# Patient Record
Sex: Female | Born: 1965 | Race: White | Hispanic: No | Marital: Married | Smoking: Never smoker
Health system: Southern US, Community
[De-identification: ages and names within clinical notes are randomized; demographics above are authoritative.]

## PROBLEM LIST (undated history)

## (undated) DIAGNOSIS — E039 Hypothyroidism, unspecified: Secondary | ICD-10-CM

## (undated) DIAGNOSIS — S92909A Unspecified fracture of unspecified foot, initial encounter for closed fracture: Secondary | ICD-10-CM

## (undated) DIAGNOSIS — K219 Gastro-esophageal reflux disease without esophagitis: Secondary | ICD-10-CM

## (undated) DIAGNOSIS — C801 Malignant (primary) neoplasm, unspecified: Secondary | ICD-10-CM

## (undated) HISTORY — DX: Gastro-esophageal reflux disease without esophagitis: K21.9

## (undated) HISTORY — DX: Unspecified fracture of unspecified foot, initial encounter for closed fracture: S92.909A

## (undated) HISTORY — DX: Hypothyroidism, unspecified: E03.9

---

## 1986-06-20 HISTORY — PX: NASAL SEPTOPLASTY W/ TURBINOPLASTY: SHX2070

## 2002-11-20 HISTORY — PX: SKIN GRAFT: SHX250

## 2004-02-01 ENCOUNTER — Encounter: Payer: Self-pay | Admitting: Internal Medicine

## 2004-02-23 ENCOUNTER — Encounter: Payer: Self-pay | Admitting: Internal Medicine

## 2005-05-19 ENCOUNTER — Ambulatory Visit: Payer: Self-pay | Admitting: Internal Medicine

## 2005-08-22 ENCOUNTER — Ambulatory Visit: Payer: Self-pay | Admitting: Obstetrics and Gynecology

## 2005-08-22 ENCOUNTER — Ambulatory Visit: Payer: Self-pay | Admitting: Internal Medicine

## 2005-12-01 ENCOUNTER — Ambulatory Visit: Payer: Self-pay | Admitting: Internal Medicine

## 2005-12-12 ENCOUNTER — Ambulatory Visit: Payer: Self-pay | Admitting: Gastroenterology

## 2006-08-26 ENCOUNTER — Ambulatory Visit: Payer: Self-pay | Admitting: Obstetrics and Gynecology

## 2007-07-09 ENCOUNTER — Telehealth: Payer: Self-pay | Admitting: Internal Medicine

## 2007-07-22 ENCOUNTER — Encounter: Payer: Self-pay | Admitting: Internal Medicine

## 2007-07-23 ENCOUNTER — Encounter: Payer: Self-pay | Admitting: Internal Medicine

## 2007-09-01 ENCOUNTER — Ambulatory Visit: Payer: Self-pay | Admitting: Obstetrics and Gynecology

## 2008-09-05 ENCOUNTER — Ambulatory Visit: Payer: Self-pay | Admitting: Obstetrics and Gynecology

## 2009-01-31 ENCOUNTER — Ambulatory Visit: Payer: Self-pay | Admitting: Internal Medicine

## 2009-01-31 DIAGNOSIS — K219 Gastro-esophageal reflux disease without esophagitis: Secondary | ICD-10-CM | POA: Insufficient documentation

## 2009-01-31 DIAGNOSIS — E039 Hypothyroidism, unspecified: Secondary | ICD-10-CM

## 2009-02-02 LAB — CONVERTED CEMR LAB
Basophils Relative: 0.8 % (ref 0.0–3.0)
CO2: 31 meq/L (ref 19–32)
Calcium: 9.2 mg/dL (ref 8.4–10.5)
Chloride: 106 meq/L (ref 96–112)
Creatinine, Ser: 0.8 mg/dL (ref 0.4–1.2)
Free T4: 0.8 ng/dL (ref 0.6–1.6)
Hemoglobin: 14.1 g/dL (ref 12.0–15.0)
Lymphocytes Relative: 26.9 % (ref 12.0–46.0)
MCHC: 34.8 g/dL (ref 30.0–36.0)
MCV: 91.6 fL (ref 78.0–100.0)
Neutrophils Relative %: 64.3 % (ref 43.0–77.0)
RBC: 4.42 M/uL (ref 3.87–5.11)
RDW: 12.7 % (ref 11.5–14.6)
Sodium: 141 meq/L (ref 135–145)

## 2009-09-07 ENCOUNTER — Ambulatory Visit: Payer: Self-pay | Admitting: Obstetrics and Gynecology

## 2010-01-10 ENCOUNTER — Ambulatory Visit: Payer: Self-pay | Admitting: Family Medicine

## 2010-01-10 DIAGNOSIS — J020 Streptococcal pharyngitis: Secondary | ICD-10-CM

## 2010-04-19 ENCOUNTER — Ambulatory Visit: Payer: Self-pay | Admitting: Internal Medicine

## 2010-04-19 DIAGNOSIS — N943 Premenstrual tension syndrome: Secondary | ICD-10-CM | POA: Insufficient documentation

## 2010-04-23 LAB — CONVERTED CEMR LAB: TSH: 4.25 microintl units/mL (ref 0.35–5.50)

## 2010-08-07 ENCOUNTER — Encounter: Payer: Self-pay | Admitting: Internal Medicine

## 2010-08-15 ENCOUNTER — Encounter: Payer: Self-pay | Admitting: Internal Medicine

## 2010-09-10 ENCOUNTER — Ambulatory Visit: Payer: Self-pay | Admitting: Obstetrics and Gynecology

## 2010-09-19 ENCOUNTER — Ambulatory Visit: Payer: Self-pay | Admitting: Internal Medicine

## 2010-09-24 ENCOUNTER — Encounter: Payer: Self-pay | Admitting: Internal Medicine

## 2010-11-19 NOTE — Assessment & Plan Note (Signed)
Summary: PROBLEM W/ THYROID ?? CYD   Vital Signs:  Patient profile:   45 year old female Weight:      128 pounds Temp:     98.4 degrees F oral Pulse rate:   68 / minute Pulse rhythm:   regular BP sitting:   130 / 80  (right arm) Cuff size:   regular  Vitals Entered By: Lowella Petties CMA (April 19, 2010 11:26 AM) CC: Discuss hormones   History of Present Illness: "my hormones are going ballistic" Had similar spell about 3 years ago but not as bad Gets a bit weepy at times perimenstrually but last week bad Irritable, crying, wanted to stay away from people up and down from day to day  "I felt like a blanket over me" then it lifted and feels back to her normal  Had period before this spell notes hot flashes around periods periods are regular on the OCP---fairly light  In generaly feels fine no depression  Mom went through menopause  ~41 but maternal GM went close to 60   Allergies: 1)  ! Adult Aspirin Low Strength (Aspirin)  Past History:  Past medical, surgical, family and social histories (including risk factors) reviewed for relevance to current acute and chronic problems.  Past Medical History: Reviewed history from 01/31/2009 and no changes required. GERD Hypothyroidism  Past Surgical History: Reviewed history from 01/31/2009 and no changes required. 1987/9 Septoplasty and turbinate reduction 2/4 Nasal skin graft (for epistaxis) 5/05 Echo normal  Family History: Reviewed history from 01/31/2009 and no changes required. Dad with atrial fib, colon cancer, HTN Mom healthy 1 brohter Pat aunt also with colon cancer CAD in maternal uncle Arthritis in family  Social History: Reviewed history from 01/31/2009 and no changes required. 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   Psych:  normally interactive, good eye contact, not anxious appearing, and not depressed appearing.     Impression & Recommendations:  Problem # 1:  PREMENSTRUAL SYNDROME (ICD-625.4) Assessment Comment Only may have premature menopause already on hormones only one month and had brief spell years ago as well may need to consider fluoxetine or venlafaxine but too soon to tell counselled entire 15 minute visit  Problem # 2:  HYPOTHYROIDISM (ICD-244.9) Assessment: Comment Only  due for labs  Her updated medication list for this problem includes:    Levoxyl 50 Mcg Tabs (Levothyroxine sodium) .Marland Kitchen... Take 1 by mouth once daily  Orders: Venipuncture (04540) TLB-TSH (Thyroid Stimulating Hormone) (84443-TSH) TLB-T4 (Thyrox), Free [phone])  Complete Medication List: 1)  Levoxyl 50 Mcg Tabs (Levothyroxine sodium) .... Take 1 by mouth once daily 2)  Ortho-cyclen (28) 0.25-35 Mg-mcg Tabs (Norgestimate-eth estradiol) .... Take by mouth daily as directed  Patient Instructions: 1)  Please schedule a follow-up appointment in 4 months for physical  Prior Medications (reviewed today): LEVOXYL 50 MCG TABS (LEVOTHYROXINE SODIUM) take 1 by mouth once daily ORTHO-CYCLEN (28) 0.25-35 MG-MCG TABS (NORGESTIMATE-ETH ESTRADIOL) take by mouth daily as directed Current Allergies: ! ADULT ASPIRIN LOW STRENGTH (ASPIRIN)

## 2010-11-19 NOTE — Assessment & Plan Note (Signed)
Summary: SWOLLEN GLANDS,FATIGUE/CLE   Vital Signs:  Patient profile:   45 year old female Height:      63 inches Weight:      127.38 pounds BMI:     22.65 Temp:     98.2 degrees F oral Pulse rate:   84 / minute Pulse rhythm:   regular BP sitting:   124 / 74  (left arm) Cuff size:   regular  Vitals Entered By: Delilah Shan CMA Duncan Dull) (January 10, 2010 3:57 PM) CC: Swollen glands, fatigue.  Dx. with Strep 5 weeks ago.   History of Present Illness: 45 yo here for follow up urgent care visit.  Was seen at urgent care on 2/21, diagnosed with strep throat and ear infection. Given 10 day course of Augmentin.  Symptoms did not completely resolve, so was then given a Zpack. Fevers, sore throat, ear pain have all improved. Still tired and feels like glands in throat are swollen. No cough, shortness of breath.  Does not have records with her today.  Current Medications (verified): 1)  Levoxyl 50 Mcg Tabs (Levothyroxine Sodium) .... Take 1 By Mouth Once Daily 2)  Bcp .... Once Daily  Allergies: 1)  ! Adult Aspirin Low Strength (Aspirin)  Review of Systems      See HPI General:  Denies chills and fever. ENT:  Denies nasal congestion and sore throat. Resp:  Denies cough, shortness of breath, sputum productive, and wheezing.  Physical Exam  General:  alert and normal appearance.   Ears:  small amount clear fluid TMs bilaterally, otherwise normal. Mouth:  Oral mucosa and oropharynx without lesions or exudates.  Teeth in good repair. Lungs:  normal respiratory effort and normal breath sounds.   Heart:  normal rate, regular rhythm, no murmur, and no gallop.   Extremities:  no edema Cervical Nodes:  No lymphadenopathy noted Axillary Nodes:  No palpable lymphadenopathy Psych:  normally interactive, good eye contact, not anxious appearing, and not depressed appearing.     Impression & Recommendations:  Problem # 1:  STREPTOCOCCAL SORE THROAT (ICD-034.0) Assessment Improved No  signs of persistent infection. Provided reassurance to pt.  Do not believe she needs another round of abx. Continue supportive care with Ibuprofen, sudafed as needed.  Complete Medication List: 1)  Levoxyl 50 Mcg Tabs (Levothyroxine sodium) .... Take 1 by mouth once daily 2)  Bcp  .... Once daily  Current Allergies (reviewed today): ! ADULT ASPIRIN LOW STRENGTH (ASPIRIN)

## 2010-11-19 NOTE — Letter (Signed)
Summary: [facility]  [facility]   Imported By: Lanelle Bal 09/19/2010 12:07:00  _____________________________________________________________________  External Attachment:    Type:   Image     Comment:   External Document

## 2010-11-19 NOTE — Assessment & Plan Note (Signed)
Summary: FU ON LAB WORK/DLO   Vital Signs:  Patient profile:   45 year old female Weight:      129 pounds Temp:     98.6 degrees F oral Pulse rate:   76 / minute Pulse rhythm:   regular BP sitting:   118 / 68  (left arm) Cuff size:   regular  Vitals Entered By: Mervin Hack CMA Duncan Dull) (September 19, 2010 3:57 PM) CC: follow-up visit   History of Present Illness: Having ongoing issues saw rheumatologist at [facility]--note reviewed labs not sent yet but were reportedly normal  Has had some improvement since going off caffeine still concerned about hair loss  has started taking B complex and still taking B complex  Has had some cycling of symptoms but not clearly with periods as it was before  still gets occ headaches but not that frequent  Allergies: 1)  ! Adult Aspirin Low Strength (Aspirin)  Past History:  Past medical, surgical, family and social histories (including risk factors) reviewed for relevance to current acute and chronic problems.  Past Medical History: Reviewed history from 01/31/2009 and no changes required. GERD Hypothyroidism  Past Surgical History: Reviewed history from 01/31/2009 and no changes required. 1987/9 Septoplasty and turbinate reduction 2/4 Nasal skin graft (for epistaxis) 5/05 Echo normal  Family History: Reviewed history from 01/31/2009 and no changes required. Dad with atrial fib, colon cancer, HTN Mom healthy 1 brohter Pat aunt also with colon cancer CAD in maternal uncle Arthritis in family  Social History: Reviewed history from 01/31/2009 and no changes required. 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 Take 1 by mouth once daily 2)  Ortho-cyclen (28) 0.25-35 Mg-mcg Tabs (Norgestimate-eth estradiol) .... Take by mouth daily as directed 3)  B Complex Tabs (B complex vitamins) .... Take 1 by mouth once daily 4)  Multivitamins Caps (Multiple vitamin) .... Take 1 by mouth once daily  Patient Instructions: 1)  Please schedule a follow-up appointment in 4-6  months for physical   Orders Added: 1)  Est. Patient Level III [[address]]    Current Allergies (reviewed today): ! ADULT ASPIRIN LOW STRENGTH (ASPIRIN)

## 2010-11-19 NOTE — Letter (Signed)
Summary: [facility]  [facility]   Imported By: Maryln Gottron 08/22/2010 13:53:21  _____________________________________________________________________  External Attachment:    Type:   Image     Comment:   External Document  Appended Document: [facility] seen for back and neck pain checking x-rays concerned about sacroiliitis trying PT

## 2010-11-21 NOTE — Letter (Signed)
Summary: [facility]  [facility]   Imported By: Maryln Gottron 10/04/2010 14:20:35  _____________________________________________________________________  External Attachment:    Type:   Image     Comment:   External Document  Appended Document: [facility] seen for  back pain and spasm Conservative symptom rx given

## 2010-12-02 ENCOUNTER — Emergency Department: Payer: Self-pay | Admitting: Internal Medicine

## 2010-12-02 DIAGNOSIS — K8020 Calculus of gallbladder without cholecystitis without obstruction: Secondary | ICD-10-CM | POA: Insufficient documentation

## 2010-12-12 ENCOUNTER — Ambulatory Visit (INDEPENDENT_AMBULATORY_CARE_PROVIDER_SITE_OTHER): Payer: BC Managed Care – PPO | Admitting: Internal Medicine

## 2010-12-12 ENCOUNTER — Encounter: Payer: Self-pay | Admitting: Internal Medicine

## 2010-12-12 DIAGNOSIS — K8020 Calculus of gallbladder without cholecystitis without obstruction: Secondary | ICD-10-CM

## 2010-12-17 NOTE — Assessment & Plan Note (Signed)
Summary: ER follow up/ [facility]/alc   Vital Signs:  Patient profile:   45 year old female Weight:      130 pounds BMI:     23.11 Temp:     98.7 degrees F oral BP sitting:   118 / 60  (left arm) Cuff size:   regular  Vitals Entered By: Mervin Hack CMA Duncan Dull) (December 12, 2010 4:35 PM) CC: hosptial follow-up   History of Present Illness: Seen in [facility] ER 10 days ago  Cooked brownies and then ate some started with cramping Awoke some time later with worsened pain Got sharper and sharper under right ribs Got sweaty and then hard to breathe  suddenly improved so she waited started again in an hour Went to ER  ~7AM Transaminases up but not alk phos or bilirubin Ultrasound did show non shadowing mass---?stone vs sludge??  Allergies: 1)  ! Adult Aspirin Low Strength (Aspirin)  Past History:  Past medical, surgical, family and social histories (including risk factors) reviewed for relevance to current acute and chronic problems.  Past Medical History: Reviewed history from 01/31/2009 and no changes required. GERD Hypothyroidism  Past Surgical History: Reviewed history from 01/31/2009 and no changes required. 1987/9 Septoplasty and turbinate reduction 2/4 Nasal skin graft (for epistaxis) 5/05 Echo normal  Family History: Reviewed history from 01/31/2009 and no changes required. Dad with atrial fib, colon cancer, HTN Mom healthy 1 brohter Pat aunt also with colon cancer CAD in maternal uncle Arthritis in family  Social History: Reviewed history from 01/31/2009 and no changes required. 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   Neck:  supple, no masses, and no cervical lymphadenopathy.   Lungs:  normal respiratory effort, no intercostal retractions, no  accessory muscle use, and normal breath sounds.   Heart:  normal rate, regular rhythm, no murmur, and no gallop.   Abdomen:  soft, non-tender, and no masses.   Murphy's sign absent Extremities:  no edema   Impression & Recommendations:  Problem # 1:  BILIARY COLIC (ICD-574.20) Assessment New classic history of biliary colic in 40+ woman Transaminases elevated without alk phos so not classic picture Gallbladder not thickened--may have stone in there but not clear cut  given classic history, should proceed with surgical evaluation she wants to set this up at [employer] where she works She has ER labs and I will give her note she will call if she needs referral  Complete Medication List: 1)  Levoxyl 50 Mcg Tabs (Levothyroxine sodium) .... Take 1 by mouth once daily 2)  Ortho-cyclen (28) 0.25-35 Mg-mcg Tabs (Norgestimate-eth estradiol) .... Take by mouth daily as directed 3)  B Complex Tabs (B complex vitamins) .... Take 1 by mouth once daily 4)  Multivitamins Caps (Multiple vitamin) .... Take 1 by mouth once daily  Patient Instructions: 1)  Please set up with general surgeon at [facility]. Call if you need my help with this   Orders Added: 1)  Est. Patient Level IV [04540]    Current Allergies (reviewed today): ! ADULT ASPIRIN LOW STRENGTH (ASPIRIN)

## 2010-12-28 ENCOUNTER — Encounter: Payer: Self-pay | Admitting: Internal Medicine

## 2011-01-07 ENCOUNTER — Telehealth: Payer: Self-pay | Admitting: *Deleted

## 2011-01-07 DIAGNOSIS — R040 Epistaxis: Secondary | ICD-10-CM

## 2011-01-07 NOTE — Telephone Encounter (Signed)
Pt has a history of nose bleeds, has been seeing [facility] but wants to change to [facility] de[facility], Dr. Gillermina Hu.  This is close to her job.  She will need a referral from [place] in order to go there.

## 2011-01-14 ENCOUNTER — Telehealth: Payer: Self-pay | Admitting: *Deleted

## 2011-01-14 NOTE — Telephone Encounter (Signed)
Is she talking about an ENT for the nosebleeds?? I think I made a referral last week  Is this something else? Please clarify--then send to [facility] as I think referral already made

## 2011-01-14 NOTE — Telephone Encounter (Signed)
Patient says that she has found a surgeon that she wants to establish with like you had advised her to do. She wants to see Dr. Radene Gunning with [facility]. They need an order and her office visit faxed to them . [phone].

## 2011-01-15 NOTE — Telephone Encounter (Signed)
Patient has history classic history of biliary colic. She was seen for ER follow up in feb. And she says that you had told her to proceed with surgical evaluation.

## 2011-01-16 NOTE — Telephone Encounter (Signed)
Faxed all notes to Dr Radene Gunning in [place] . MK

## 2011-02-24 ENCOUNTER — Ambulatory Visit (INDEPENDENT_AMBULATORY_CARE_PROVIDER_SITE_OTHER): Payer: BC Managed Care – PPO | Admitting: Internal Medicine

## 2011-02-24 ENCOUNTER — Encounter: Payer: Self-pay | Admitting: Internal Medicine

## 2011-02-24 VITALS — BP 108/62 | HR 80 | Temp 98.3°F | Ht 63.0 in | Wt 130.0 lb

## 2011-02-24 DIAGNOSIS — E039 Hypothyroidism, unspecified: Secondary | ICD-10-CM

## 2011-02-24 DIAGNOSIS — K219 Gastro-esophageal reflux disease without esophagitis: Secondary | ICD-10-CM

## 2011-02-24 DIAGNOSIS — Z0000 Encounter for general adult medical examination without abnormal findings: Secondary | ICD-10-CM

## 2011-02-24 NOTE — Progress Notes (Signed)
Subjective:    Patient ID: Amy Riley, female    DOB: 1966-06-19, 45 y.o.   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  , Disp: , Rfl: ;  levothyroxine (SYNTHROID, LEVOTHROID) 50 MCG tablet, Take 50 mcg by mouth daily.  , Disp: , Rfl: ;  Multiple Vitamin (MULTIVITAMIN PO), Take by mouth daily.  , Disp: , Rfl: ;  norgestimate-ethinyl estradiol (ORTHO-CYCLEN) 0.25-35 MG-MCG per tablet, Take 1 tablet by mouth daily.  , Disp: , Rfl:   Past Medical History  Diagnosis Date  . GERD (gastroesophageal reflux disease)   . Hypothyroidism     Past Surgical History  Procedure Date  . Nasal septoplasty w/ turbinoplasty 06/1986    reduction  . Skin graft 11/2002    Nasal (for epistaxis)    Family History  Problem Relation Age of Onset  . Heart disease Father     atrial fib  . Cancer Father     colon cancer  . Hypertension Father   . Heart disease Maternal Uncle     CAD  . Cancer Paternal Aunt     colon cancer    History   Social History  . Marital Status: Married    Spouse Name: N/A    Number of Children: 0  . Years of Education: N/A   Occupational History  . 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 Smoking status: Never Smoker   . Smokeless tobacco: Not on file  . Alcohol Use: No  . Drug Use: Not on file  . Sexually Active: Not on file   Other Topics Concern  . Not on file   Social History Narrative   Regular exercise Yes   Review of Systems    Constitutional: Negative for fever, fatigue and unexpected weight change.       Wears seat belt  HENT: Positive for neck pain, dental problem and tinnitus. Negative for hearing loss.        Seeing new ENT--prior one is moving Seasonal allergies Chronic tinnitus  Eyes: Negative for visual disturbance.       No focal vision loss or diplopia  Respiratory: Negative for cough, chest tightness and shortness of breath.   Cardiovascular: Positive for palpitations and leg swelling. Negative for chest pain.       Strong heartbeat---echo fine in past Socks make mark on calves  Gastrointestinal: Negative for nausea, vomiting, abdominal pain, constipation and blood in stool.       No heartburn  Genitourinary: Negative for dysuria, urgency, difficulty urinating, menstrual problem and dyspareunia.  Musculoskeletal: Positive for arthralgias. Negative for back pain and joint swelling.       Chronic knee pain Back is mostly better  Skin: Negative for rash.       No suspicious lesions but has some itchy "pinhead" red dots---comes and goes (on torso)  Neurological: Positive for headaches. Negative for dizziness, syncope, weakness and numbness.       Was having  daily headaches---hopefully will be better after abscess done  Hematological: Negative for adenopathy. Does not bruise/bleed easily.  Psychiatric/Behavioral: Negative for suicidal ideas and dysphoric mood. The patient is not nervous/anxious.        Objective:   Physical Exam  Constitutional: She is oriented to person, place, and time. She appears well-developed and well-nourished. No distress.  HENT:  Head: Normocephalic and atraumatic.  Right Ear: External ear normal.  Left Ear: External ear normal.  Mouth/Throat: Oropharynx is clear and moist. No oropharyngeal exudate.       TMs fine  Eyes: Conjunctivae and EOM are normal. Pupils are equal, round, and reactive to light.       Fundi benign  Neck: Normal range of motion. Neck supple. No  thyromegaly present.  Cardiovascular: Normal rate, regular rhythm, normal heart sounds and intact distal pulses.  Exam reveals no gallop.   No murmur heard. Pulmonary/Chest: Effort normal and breath sounds normal. No respiratory distress. She has no wheezes. She has no rales.  Abdominal: Soft. She exhibits no mass. There is no tenderness.  Musculoskeletal: Normal range of motion. She exhibits no edema and no tenderness.  Lymphadenopathy:    She has no cervical adenopathy.  Neurological: She is alert and oriented to person, place, and time. She exhibits normal muscle tone.       No weakness  Skin: Skin is warm. No rash noted.       Slight dry papules--discussed cortaid  Psychiatric: She has a normal mood and affect. Her behavior is normal. Judgment and thought content normal.          Assessment & Plan:

## 2011-02-25 LAB — CBC WITH DIFFERENTIAL/PLATELET
Basophils Absolute: 0.3 10*3/uL — ABNORMAL HIGH (ref 0.0–0.1)
Basophils Relative: 3.4 % — ABNORMAL HIGH (ref 0.0–3.0)
Eosinophils Relative: 2.1 % (ref 0.0–5.0)
HCT: 38.7 % (ref 36.0–46.0)
Hemoglobin: 13.4 g/dL (ref 12.0–15.0)
Lymphocytes Relative: 26.7 % (ref 12.0–46.0)
Lymphs Abs: 2.2 10*3/uL (ref 0.7–4.0)
Monocytes Relative: 9.2 % (ref 3.0–12.0)
Neutro Abs: 4.9 10*3/uL (ref 1.4–7.7)
RBC: 4.15 Mil/uL (ref 3.87–5.11)
RDW: 14 % (ref 11.5–14.6)

## 2011-02-25 LAB — HEPATIC FUNCTION PANEL
Albumin: 3.7 g/dL (ref 3.5–5.2)
Alkaline Phosphatase: 49 U/L (ref 39–117)
Total Protein: 7.2 g/dL (ref 6.0–8.3)

## 2011-02-25 LAB — BASIC METABOLIC PANEL
BUN: 13 mg/dL (ref 6–23)
Calcium: 9 mg/dL (ref 8.4–10.5)
GFR: 104.18 mL/min (ref >60.00)
Potassium: 3.9 mEq/L (ref 3.5–5.1)
Sodium: 137 mEq/L (ref 135–145)

## 2011-02-25 LAB — TSH: TSH: 4.66 u[IU]/mL (ref 0.35–5.50)

## 2011-02-25 LAB — T4, FREE: Free T4: 0.78 ng/dL (ref 0.60–1.60)

## 2011-08-11 ENCOUNTER — Other Ambulatory Visit: Payer: Self-pay | Admitting: *Deleted

## 2011-08-11 MED ORDER — LEVOTHYROXINE SODIUM 50 MCG PO TABS: 50.0000 ug | ORAL_TABLET | Freq: Every day | ORAL | Status: DC

## 2011-10-08 ENCOUNTER — Ambulatory Visit: Payer: Self-pay | Admitting: Obstetrics and Gynecology

## 2011-12-16 DIAGNOSIS — C44310 Basal cell carcinoma of skin of unspecified parts of face: Secondary | ICD-10-CM | POA: Insufficient documentation

## 2012-02-13 ENCOUNTER — Telehealth: Payer: Self-pay

## 2012-02-13 NOTE — Telephone Encounter (Signed)
I would recommend a dermatologist Perhaps she would like to find one at [facility]?

## 2012-02-13 NOTE — Telephone Encounter (Signed)
Pt has daily hair loss; no patches of hair loss yet. Pt wants to know what her options are to thicken hair or decrease hair loss. Pt was not sure if Dr Alphonsus Sias would want to see pt or could suggest a specialist. Pt last seen 02/24/11. Contact # [phone] no answer may leave detailed message.

## 2012-02-18 NOTE — Telephone Encounter (Signed)
Spoke with patient and advised results, she will call her dermatologist.

## 2012-02-27 DIAGNOSIS — M67919 Unspecified disorder of synovium and tendon, unspecified shoulder: Secondary | ICD-10-CM | POA: Insufficient documentation

## 2012-03-09 ENCOUNTER — Telehealth: Payer: Self-pay

## 2012-03-09 NOTE — Telephone Encounter (Signed)
[facility] said Levoxyl 50 mcg is manufacture back ordered. [facility] wants to know what to substitute for pt. According to med list Levothyroxine 50 mcg was sent electronically 08/11/11 to [facility]. [facility] said pt has been getting Levoxyl 50 mcg. Please advise.

## 2012-03-10 NOTE — Telephone Encounter (Signed)
I think any levothyroxine should be fine Ask pharmacist to switch but to try to stick with the same generic company

## 2012-03-10 NOTE — Telephone Encounter (Signed)
Spoke with pharmacist and advised results   

## 2012-05-31 ENCOUNTER — Ambulatory Visit (INDEPENDENT_AMBULATORY_CARE_PROVIDER_SITE_OTHER): Payer: BC Managed Care – PPO | Admitting: Family Medicine

## 2012-05-31 ENCOUNTER — Encounter: Payer: Self-pay | Admitting: Family Medicine

## 2012-05-31 VITALS — BP 120/72 | HR 88 | Temp 98.6°F | Ht 63.0 in | Wt 132.5 lb

## 2012-05-31 DIAGNOSIS — R590 Localized enlarged lymph nodes: Secondary | ICD-10-CM

## 2012-05-31 DIAGNOSIS — R599 Enlarged lymph nodes, unspecified: Secondary | ICD-10-CM

## 2012-05-31 NOTE — Progress Notes (Signed)
   Nature conservation officer at [facility] [address] [place] [address] Phone: [phone] Fax: [phone]  Date:  05/31/2012   Name:  BLAKLEIGH STRAW   DOB:  05/30/1966   MRN:  [id number] Gender: female  Age: 46 y.o.  PCP:  Tillman Abide, MD    Chief Complaint: Hernia   History of Present Illness:  REBBECA SHEPERD is a 46 y.o. very pleasant female patient who presents with the following:  Pleasant patient who had some mild discomfort in her R inguinal region over the last week. No bulging, but she did notice an enlargement, which has seemed to improve over this week. She also had a very bad hemorrhoid flare around the same time. No potential STD exposure. No known ingrown hairs.  Past Medical History, Surgical History, Social History, Family History, Problem List, Medications, and Allergies have been reviewed and updated if relevant.  Current Outpatient Prescriptions on File Prior to Visit  Medication Sig Dispense Refill  . levothyroxine (SYNTHROID, LEVOTHROID) 50 MCG tablet Take 1 tablet (50 mcg total) by mouth daily.  30 tablet  11  . norgestimate-ethinyl estradiol (ORTHO-CYCLEN) 0.25-35 MG-MCG per tablet Take 1 tablet by mouth daily.        Marland Kitchen b complex vitamins tablet Take 1 tablet by mouth daily.        . Multiple Vitamin (MULTIVITAMIN PO) Take by mouth daily.          Review of Systems:  GEN: No acute illnesses, no fevers, chills. GI: No n/v/d, eating normally Pulm: No SOB Interactive and getting along well at home.  Otherwise, ROS is as per the HPI.   Physical Examination: Filed Vitals:   05/31/12 1550  BP: 120/72  Pulse: 88  Temp: 98.6 F (37 C)   Filed Vitals:   05/31/12 1550  Height: 5\' 3"  (1.6 m)  Weight: 132 lb 8 oz (60.102 kg)   Body mass index is 23.47 kg/(m^2). Ideal Body Weight: Weight in (lb) to have BMI = 25: 140.8    GEN: WDWN, NAD, Non-toxic, Alert & Oriented x 3 HEENT: Atraumatic, Normocephalic.  Ears and Nose: No external  deformity. EXTR: No clubbing/cyanosis/edema NEURO: Normal gait.  PSYCH: Normally interactive. Conversant. Not depressed or anxious appearing.  Calm demeanor.  LAD: + small inguinal lymph node in the area in question that is mildly TTP, there is no bulging felt in this area with multiple coughs  Assessment and Plan: 1. Lymphadenopathy, inguinal     R resolving LAD I see no evidence for hernia  Reassured, return to sports is appropriate. Even if i am wrong and this is a small hernia, my advice was to be cautious and follow conservatively  Hannah Beat, MD

## 2012-06-28 ENCOUNTER — Telehealth: Payer: Self-pay | Admitting: Internal Medicine

## 2012-06-28 NOTE — Telephone Encounter (Signed)
Caller: Ahriana/Patient; Phone: [phone]; Reason for Call: Patient would like to give Dr Alphonsus Sias an update on her status regarding hair loss.

## 2012-06-29 MED ORDER — LEVOTHYROXINE SODIUM 50 MCG PO TABS: 50.0000 ug | ORAL_TABLET | Freq: Every day | ORAL | Status: DC

## 2012-06-29 MED ORDER — NORGESTIMATE-ETH ESTRADIOL 0.25-35 MG-MCG PO TABS: 1.0000 | ORAL_TABLET | Freq: Every day | ORAL | Status: DC

## 2012-06-29 NOTE — Telephone Encounter (Signed)
Brand name rx's sent to pharmacy

## 2012-06-29 NOTE — Telephone Encounter (Signed)
Okay to change to brand only for her thyroid and oral contraceptive Can send new Rx or might be better to call pharmacist to confirm brand only

## 2012-06-29 NOTE — Telephone Encounter (Signed)
Patient went to see a dermatologist at [facility] for hair loss and they did a lot of labs, pt will have those faxed to [place], also pt requesting brand name on her thyroid and birth control medications, the dermatologist thought that might be part of the problem but won't be sure until the labs come back. Pt just wanted Dr.Letvak to be aware, pt states over 50% of her hair is coming out.

## 2012-07-12 ENCOUNTER — Ambulatory Visit (INDEPENDENT_AMBULATORY_CARE_PROVIDER_SITE_OTHER): Payer: BC Managed Care – PPO | Admitting: Internal Medicine

## 2012-07-12 ENCOUNTER — Encounter: Payer: Self-pay | Admitting: Internal Medicine

## 2012-07-12 VITALS — BP 110/70 | HR 92 | Temp 98.7°F | Wt 128.0 lb

## 2012-07-12 DIAGNOSIS — F411 Generalized anxiety disorder: Secondary | ICD-10-CM

## 2012-07-12 NOTE — Patient Instructions (Signed)
Please start vitamin D 1000 mcg daily (total dose) Please call if your anxiety is not improving over the next couple of weeks---then we can consider therapy

## 2012-07-12 NOTE — Assessment & Plan Note (Signed)
Reviewed all the research she did as well as her emails to doctor at [facility] Reviewed her labs, etc  Recommend vitamin D in modest daily doses Wonders about her OCP ---I recommended she continue for now  Discussed starting anti anxiety meds---I recommended not  If biopsy shows normal hair follicle, and she has telogen effluvium, we will have to see if she is able to relax with that reassurance and get over her anxiety If not, or if she has some systemic cause for hair loss, may need to consider Rx for the anxiety (I would try low dose citalopram) 30 minute visit---over half in counseling

## 2012-07-12 NOTE — Progress Notes (Signed)
  Subjective:    Patient ID: Amy Riley, female    DOB: 1966-07-15, 46 y.o.   MRN: [id number]  HPI Reviewed evaluation at [facility] Going back tomorrow for scalp biopsy  She has many questions about her condition and her labs Initial evaluation at [facility] Dermatology suggested this was just perimenopausal Then sped up so she went to [facility]  Has done her own extensive research  Has created her own timeline for all the stress she has had in the past couple of years Multiple dental infections and work with her Husband with surgery for swallowing, and bad bouts with kidney stones Loves job but it is stressful as well  Doesn't feel she has been depressed Just very upset and stressed with her hair falling out---hard to deal with Nerves---"like a coffee percolator working inside me"  Gets pressure in chest Stomach gets nervous Multiple somatic concerns Very anxious Now can't get herself back under control  Current Outpatient Prescriptions on File Prior to Visit  Medication Sig Dispense Refill  . b complex vitamins tablet Take 1 tablet by mouth daily.        Marland Kitchen levothyroxine (SYNTHROID, LEVOTHROID) 50 MCG tablet Take 1 tablet (50 mcg total) by mouth daily. BRAND NAME ONLY  30 tablet  11  . Multiple Vitamin (MULTIVITAMIN PO) Take by mouth daily.        . norgestimate-ethinyl estradiol (ORTHO-CYCLEN,SPRINTEC,PREVIFEM) 0.25-35 MG-MCG tablet Take 1 tablet by mouth daily. BRAND NAME ONLY  1 Package  11    Allergies  Allergen Reactions  . Aspirin     REACTION: nose bleeds    Past Medical History  Diagnosis Date  . GERD (gastroesophageal reflux disease)   . Hypothyroidism     Past Surgical History  Procedure Date  . Nasal septoplasty w/ turbinoplasty 06/1986    reduction  . Skin graft 11/2002    Nasal (for epistaxis)    Family History  Problem Relation Age of Onset  . Heart disease Father     atrial fib  . Cancer Father     colon cancer  . Hypertension Father   . Heart  disease Maternal Uncle     CAD  . Cancer Paternal Aunt     colon cancer    History   Social History  . Marital Status: Married    Spouse Name: N/A    Number of Children: 0  . Years of Education: N/A   Occupational History  . 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 Smoking status: Never Smoker   . Smokeless tobacco: Never Used  . Alcohol Use: No  . Drug Use: Not on file  . Sexually Active: Not on file   Other Topics Concern  . Not on file   Social History Narrative   Regular exercise Yes   Review of Systems Sleeps with 1 tylenol PM Appetite is okay---has lost 3# recently. Still feels hungry    Objective:   Physical Exam  Psychiatric:       Very anxious but appropriate affect No thought process disturbance Speech is just slightly pressured but otherwise normal          Assessment & Plan:

## 2012-08-03 ENCOUNTER — Telehealth: Payer: Self-pay

## 2012-08-03 NOTE — Telephone Encounter (Signed)
Pt request referral to hair loss specialist; pt has appt at [facility] for [facility] loss specialist but could not get appt until next year. Pt request referral in [facility] if none available will go wherever Dr Alphonsus Sias recommends.Please advise.

## 2012-08-04 NOTE — Telephone Encounter (Signed)
Called patient back and told her Dr Vincent Gros did not know any hair loss specialists. I did tell her to look into [facility] in [place] and she said thanks You that she would start looking into someone there.

## 2012-08-04 NOTE — Telephone Encounter (Signed)
I don't know of a hair loss specialist locally  Any ideas , Shirlee Limerick?

## 2012-08-09 ENCOUNTER — Telehealth: Payer: Self-pay | Admitting: *Deleted

## 2012-08-09 NOTE — Telephone Encounter (Signed)
Pt called and left v/m stating she is still having issues with anxiety and does want to try medication. She wants something low dose.

## 2012-08-09 NOTE — Telephone Encounter (Signed)
My plan if her anxiety didn't improve was to start citalopram 10mg  daily Send Rx for #30 x 1 Set up appt in 3-4 weeks to review how she is doing  She should call if she has any apparent side effects

## 2012-08-10 MED ORDER — CITALOPRAM HYDROBROMIDE 10 MG PO TABS: 10.0000 mg | ORAL_TABLET | Freq: Every day | ORAL | Status: DC

## 2012-08-10 NOTE — Telephone Encounter (Signed)
I have not heard of abnormal bleeding with this Side effects of meds listed are usually pages long. Most common with this is irritability (mostly when treating depression, not anxiety) and fatigue. I am unaware of any irreversible side effects (so if there is a problem, you just stop). Pharmacist will give a more complete listing of possible side effects I have prescribed the lowest dosage available and if she wants, she can try 1/2 tab for the first few days to make sure she has no problems

## 2012-08-10 NOTE — Telephone Encounter (Signed)
Spoke with patient and advised results, she will call if any problems 

## 2012-08-10 NOTE — Telephone Encounter (Signed)
Patient called back because when she looked up the medication it states if you have abnormal bleeding then you shouldn't take this medication. Pt states she does have severe nose bleeds and hemorrhages, pt wanted to know all side effects of medication if possible.

## 2012-08-10 NOTE — Telephone Encounter (Signed)
Left message on patient's confidential VM, advised pt to call if any questions and to also call to scheduled appt in 3-4 weeks rx sent to pharmacy by e-script

## 2012-08-20 LAB — HM MAMMOGRAPHY: HM Mammogram: NORMAL

## 2012-09-02 ENCOUNTER — Ambulatory Visit: Payer: BC Managed Care – PPO | Admitting: Internal Medicine

## 2012-10-14 ENCOUNTER — Ambulatory Visit: Payer: Self-pay | Admitting: Obstetrics and Gynecology

## 2013-01-14 DIAGNOSIS — M542 Cervicalgia: Secondary | ICD-10-CM | POA: Insufficient documentation

## 2013-06-09 DIAGNOSIS — R040 Epistaxis: Secondary | ICD-10-CM | POA: Insufficient documentation

## 2013-06-09 DIAGNOSIS — J310 Chronic rhinitis: Secondary | ICD-10-CM | POA: Insufficient documentation

## 2013-06-10 ENCOUNTER — Encounter: Payer: Self-pay | Admitting: Internal Medicine

## 2013-06-10 ENCOUNTER — Ambulatory Visit (INDEPENDENT_AMBULATORY_CARE_PROVIDER_SITE_OTHER): Payer: BC Managed Care – PPO | Admitting: Internal Medicine

## 2013-06-10 VITALS — BP 100/60 | HR 86 | Temp 98.4°F | Wt 132.0 lb

## 2013-06-10 DIAGNOSIS — R198 Other specified symptoms and signs involving the digestive system and abdomen: Secondary | ICD-10-CM

## 2013-06-10 DIAGNOSIS — R194 Change in bowel habit: Secondary | ICD-10-CM

## 2013-06-10 NOTE — Progress Notes (Signed)
  Subjective:    Patient ID: Amy Riley, female    DOB: 11-12-1965, 47 y.o.   MRN: [id number]  HPI Has noticed gradual worsening in bowel changes Thinks it may be hormonal mediated More bloating, then may be normal bowels, then irregular  Clearly cyclic When "irritated"--- more frequent and looser stools Had really bad day 1 week ago (6 quick looser stools)---prompted appt  May have short constipated spells--tried dulcolax a couple of times Wondered if the slim fast was related Started a probiotic---?helping some. Taking for 2 weeks now No blood in the stool No fiber supplement  Hair is better On flutamide and rogaine  Anxiety is better Never took the citalopram  Current Outpatient Prescriptions on File Prior to Visit  Medication Sig Dispense Refill  . levothyroxine (SYNTHROID, LEVOTHROID) 50 MCG tablet Take 1 tablet (50 mcg total) by mouth daily. BRAND NAME ONLY  30 tablet  11  . norgestimate-ethinyl estradiol (ORTHO-CYCLEN,SPRINTEC,PREVIFEM) 0.25-35 MG-MCG tablet Take 1 tablet by mouth daily. BRAND NAME ONLY  1 Package  11   No current facility-administered medications on file prior to visit.    Allergies  Allergen Reactions  . Aspirin     REACTION: nose bleeds    Past Medical History  Diagnosis Date  . GERD (gastroesophageal reflux disease)   . Hypothyroidism     Past Surgical History  Procedure Laterality Date  . Nasal septoplasty w/ turbinoplasty  06/1986    reduction  . Skin graft  11/2002    Nasal (for epistaxis)    Family History  Problem Relation Age of Onset  . Heart disease Father     atrial fib  . Cancer Father     colon cancer  . Hypertension Father   . Heart disease Maternal Uncle     CAD  . Cancer Paternal Aunt     colon cancer    History   Social History  . Marital Status: Married    Spouse Name: N/A    Number of Children: 0  . Years of Education: N/A   Occupational History  . 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 Smoking status: Never Smoker   . Smokeless tobacco: Never Used  . Alcohol Use: No  . Drug Use: Not on file  . Sexual Activity: Not on file   Other Topics Concern  . Not on file   Social History Narrative   Regular exercise Yes   Review of Systems Appetite is fine Weight is stable     Objective:   Physical Exam  Constitutional: She appears well-developed and well-nourished. No distress.  Abdominal: Soft. Bowel sounds are normal. She exhibits no distension and no mass. There is no tenderness. There is no rebound and no guarding.          Assessment & Plan:

## 2013-06-10 NOTE — Assessment & Plan Note (Signed)
Fairly classic history of IBS Nothing to suggest IBD Ongoing for some time  Recommended she continue the probiotic for a month or so If ineffective, stop and try fiber  Should get colonoscopy since she is close to 49 and dad had colon cancer

## 2013-06-10 NOTE — Patient Instructions (Signed)
If you are not better on the probiotic over the next 2-3 weeks, please stop and try a fiber supplement.  Please look into the GI doctor at [facility] you want to see for your colonoscopy. This should be done soon. I can make a referral if that is needed.

## 2013-07-12 ENCOUNTER — Other Ambulatory Visit: Payer: Self-pay | Admitting: *Deleted

## 2013-07-12 NOTE — Telephone Encounter (Signed)
Ok to fill? Last labs 2012

## 2013-07-13 MED ORDER — LEVOTHYROXINE SODIUM 50 MCG PO TABS: 50.0000 ug | ORAL_TABLET | Freq: Every day | ORAL | Status: DC

## 2013-07-13 NOTE — Telephone Encounter (Signed)
Okay to fill for a year Will need labs at her next appt

## 2013-07-13 NOTE — Telephone Encounter (Signed)
rx sent to pharmacy by e-script  

## 2013-08-11 ENCOUNTER — Telehealth: Payer: Self-pay | Admitting: *Deleted

## 2013-08-11 NOTE — Telephone Encounter (Signed)
Called pt to discuss form we received from his insurance, asking [place] to check pt's thyroid, per Dr.Letvak pt needs a physical and labwork. I scheduled pt for 08/24/2013 because she's off work that day and it's hard for her to get here ( she works in healthcare also) I will have form scanned

## 2013-08-25 ENCOUNTER — Other Ambulatory Visit: Payer: Self-pay

## 2013-08-25 ENCOUNTER — Ambulatory Visit (INDEPENDENT_AMBULATORY_CARE_PROVIDER_SITE_OTHER): Payer: BC Managed Care – PPO | Admitting: Internal Medicine

## 2013-08-25 ENCOUNTER — Encounter: Payer: Self-pay | Admitting: Internal Medicine

## 2013-08-25 VITALS — BP 120/80 | HR 95 | Temp 97.7°F | Ht 63.0 in | Wt 131.0 lb

## 2013-08-25 DIAGNOSIS — E039 Hypothyroidism, unspecified: Secondary | ICD-10-CM

## 2013-08-25 DIAGNOSIS — Z0000 Encounter for general adult medical examination without abnormal findings: Secondary | ICD-10-CM

## 2013-08-25 NOTE — Assessment & Plan Note (Signed)
Generally healthy Working on fitness Will check sugar, lipids

## 2013-08-25 NOTE — Progress Notes (Signed)
Subjective:    Patient ID: Amy Riley, female    DOB: [birth year], 47 y.o.   MRN: [id number]  HPI Here for physical  Still sees Dr DeFrancesco Pap and mammo last year  Moving to a new home Having stress with this---less exercise.  Has treadmill and will now have an exercise room  No problems with the thyroid No changes in hair and nails  bowels are better with probiotic  Current Outpatient Prescriptions on File Prior to Visit  Medication Sig Dispense Refill  . levothyroxine (SYNTHROID, LEVOTHROID) 50 MCG tablet Take 1 tablet (50 mcg total) by mouth daily. BRAND NAME ONLY  30 tablet  11  . Minoxidil 5 % FOAM Apply topically daily.      . norgestimate-ethinyl estradiol (ORTHO-CYCLEN,SPRINTEC,PREVIFEM) 0.25-35 MG-MCG tablet Take 1 tablet by mouth daily. BRAND NAME ONLY  1 Package  11   No current facility-administered medications on file prior to visit.    Allergies  Allergen Reactions  . Aspirin     REACTION: nose bleeds    Past Medical History  Diagnosis Date  . GERD (gastroesophageal reflux disease)   . Hypothyroidism     Past Surgical History  Procedure Laterality Date  . Nasal septoplasty w/ turbinoplasty  06/1986    reduction  . Skin graft  11/2002    Nasal (for epistaxis)    Family History  Problem Relation Age of Onset  . Heart disease Father     atrial fib  . Cancer Father     colon cancer  . Hypertension Father   . Heart disease Maternal Uncle     CAD  . Cancer Paternal Aunt     colon cancer    History   Social History  . Marital Status: Married    Spouse Name: N/A    Number of Children: 0  . Years of Education: N/A   Occupational History  . 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 Smoking status: Never Smoker   . Smokeless tobacco: Never Used  . Alcohol Use: No  . Drug Use: Not on file  . Sexual Activity: Not on file   Other Topics Concern  . Not on file   Social History Narrative   Regular exercise Yes   Review of Systems  Constitutional: Negative for fatigue and unexpected weight change.       Wears seat belt  HENT: Positive for congestion, rhinorrhea and tinnitus. Negative for dental problem and hearing loss.        Regular with dentist  Eyes: Negative for visual disturbance.       No diplopia or unilateral vision loss  Respiratory: Negative for cough, chest tightness and shortness of breath.   Cardiovascular: Positive for palpitations. Negative for chest pain and leg swelling.       Rare irregular beat---had holter in past and it was benign  Gastrointestinal: Negative for nausea, vomiting, abdominal pain, constipation and blood in stool.       Rare zantac for heartburn  Endocrine: Negative for cold intolerance and heat intolerance.  Genitourinary: Negative for dysuria, frequency, hematuria, difficulty urinating and dyspareunia.  Musculoskeletal: Positive for arthralgias. Negative for back pain and joint swelling.       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       Hair loss is better---stopped the medication Mohs surgery on face about a year ago--- regular derm evals  Allergic/Immunologic:  Positive for environmental allergies. Negative for immunocompromised state.       Tends to have bad allergies but tries to avoid meds  Neurological: Negative for dizziness, syncope, weakness, light-headedness, numbness and headaches.  Hematological: Negative for adenopathy. Bruises/bleeds easily.  Psychiatric/Behavioral: Negative for sleep disturbance and dysphoric mood. The patient is not nervous/anxious.        Objective:   Physical Exam  Constitutional: She is oriented to person, place, and time. She appears well-developed and well-nourished. No distress.  HENT:  Head: Normocephalic and atraumatic.  Right Ear: External ear normal.  Left Ear: External ear normal.  Mouth/Throat: Oropharynx is clear and moist. No oropharyngeal  exudate.  Eyes: Conjunctivae and EOM are normal. Pupils are equal, round, and reactive to light.  Neck: Normal range of motion. Neck supple. No thyromegaly present.  Cardiovascular: Normal rate, regular rhythm, normal heart sounds and intact distal pulses.  Exam reveals no gallop.   No murmur heard. Pulmonary/Chest: Effort normal and breath sounds normal. No respiratory distress. She has no wheezes. She has no rales.  Abdominal: Soft. There is no tenderness.  Musculoskeletal: She exhibits no edema and no tenderness.  Lymphadenopathy:    She has no cervical adenopathy.  Neurological: She is alert and oriented to person, place, and time.  Skin: No rash noted. No erythema.  Psychiatric: She has a normal mood and affect. Her behavior is normal.          Assessment & Plan:

## 2013-08-25 NOTE — Assessment & Plan Note (Signed)
Seems euthyroid ?Will check labs ?

## 2013-08-26 LAB — BASIC METABOLIC PANEL
BUN: 12 mg/dL (ref 6–23)
CO2: 27 mEq/L (ref 19–32)
Chloride: 101 mEq/L (ref 96–112)
GFR: 84.77 mL/min (ref >60.00)
Glucose, Bld: 55 mg/dL — ABNORMAL LOW (ref 70–99)
Potassium: 4 mEq/L (ref 3.5–5.1)
Sodium: 135 mEq/L (ref 135–145)

## 2013-08-26 LAB — CBC WITH DIFFERENTIAL/PLATELET
Basophils Absolute: 0.4 10*3/uL — ABNORMAL HIGH (ref 0.0–0.1)
HCT: 39.3 % (ref 36.0–46.0)
Hemoglobin: 13.2 g/dL (ref 12.0–15.0)
Lymphs Abs: 2.7 10*3/uL (ref 0.7–4.0)
MCHC: 33.6 g/dL (ref 30.0–36.0)
MCV: 91.4 fl (ref 78.0–100.0)
Monocytes Absolute: 0.9 10*3/uL (ref 0.1–1.0)
Monocytes Relative: 7.2 % (ref 3.0–12.0)
Neutro Abs: 7.6 10*3/uL (ref 1.4–7.7)
RDW: 13.5 % (ref 11.5–14.6)

## 2013-08-26 LAB — LIPID PANEL: Cholesterol: 207 mg/dL — ABNORMAL HIGH (ref 0–200)

## 2013-08-26 LAB — HEPATIC FUNCTION PANEL
Albumin: 4 g/dL (ref 3.5–5.2)
Alkaline Phosphatase: 46 U/L (ref 39–117)
Total Protein: 7.8 g/dL (ref 6.0–8.3)

## 2013-09-01 LAB — HM PAP SMEAR: HM PAP: NORMAL

## 2013-09-02 LAB — HM PAP SMEAR: HM Pap smear: NEGATIVE

## 2013-10-17 ENCOUNTER — Ambulatory Visit: Payer: Self-pay | Admitting: Obstetrics and Gynecology

## 2013-10-25 ENCOUNTER — Encounter: Payer: Self-pay | Admitting: Internal Medicine

## 2014-02-19 ENCOUNTER — Encounter: Payer: Self-pay | Admitting: Internal Medicine

## 2014-02-19 DIAGNOSIS — R194 Change in bowel habit: Secondary | ICD-10-CM

## 2014-05-25 ENCOUNTER — Telehealth: Payer: Self-pay | Admitting: Internal Medicine

## 2014-05-25 MED ORDER — HYDROCORTISONE 2.5 % RE CREA: 1.0000 "application " | TOPICAL_CREAM | Freq: Three times a day (TID) | RECTAL | Status: DC

## 2014-05-25 NOTE — Telephone Encounter (Signed)
Pt called today stating she has really bad hemorrhoids. 2 hemorrhoids side by side and pt left work early yesterday and took off today. Currently using preparation H ointment and spasotories. Pt would like to try a cream that was reccommended by a co-worker called Nifedipine. [facility] in [place]. Pt would like to see if she could get this cream called in for her or will she need appt?   [phone]

## 2014-05-25 NOTE — Telephone Encounter (Signed)
There is limited evidence about topical nifedipine but it has been used. Call [facility] and see if they can compound it because I don't see it listed as commercially available. (and if they have dosing recommendations) I am okay with sending Rx for anusol HC cream (generic) if she wants. Apply tid prn. #1 tube x 1 refill

## 2014-05-25 NOTE — Telephone Encounter (Signed)
Spoke with patient and advised results rx sent to pharmacy by e-script  

## 2014-05-29 ENCOUNTER — Ambulatory Visit (INDEPENDENT_AMBULATORY_CARE_PROVIDER_SITE_OTHER): Payer: BC Managed Care – PPO | Admitting: Internal Medicine

## 2014-05-29 ENCOUNTER — Encounter: Payer: Self-pay | Admitting: Internal Medicine

## 2014-05-29 VITALS — BP 120/80 | HR 84 | Temp 97.8°F | Wt 134.0 lb

## 2014-05-29 DIAGNOSIS — F418 Other specified anxiety disorders: Secondary | ICD-10-CM | POA: Insufficient documentation

## 2014-05-29 DIAGNOSIS — F411 Generalized anxiety disorder: Secondary | ICD-10-CM

## 2014-05-29 MED ORDER — SERTRALINE HCL 50 MG PO TABS: 50.0000 mg | ORAL_TABLET | Freq: Every day | ORAL | Status: DC

## 2014-05-29 NOTE — Patient Instructions (Signed)
Please cut the first 2 sertraline in half and start with 25mg  for the first 4 days---then increase to the full tab

## 2014-05-29 NOTE — Assessment & Plan Note (Signed)
Not GAD Always type A and controlling but now too much for her to handle Now with abdominal symptoms, chest feeling and palps Has not done well with self care---needs time off, regular exercise, etc Ready to try meds--will avoid benzos Trial of low dose sertraline

## 2014-05-29 NOTE — Progress Notes (Signed)
Subjective:    Patient ID: Amy Riley, female    DOB: Dec 23, 1965, 48 y.o.   MRN: [id number]  HPI Having lots of stress Dad had stroke some months ago---still struggling with this an dis frustrating for all the family She handles all their bills, etc  Husband with several medical problems--back, kidney stones Moved to new home-she had to do most of that on her own  Always Type A but generally could handle her mood Now all the stressors have really caught up with her  Feels anxious most of the time Has feeling in chest-- like a cramp. First felt years ago when stressed, but now much more regular Pulse seems faster Too much responsibility Chronic nervous stomach-- long standing urgency then would need to go. Now feels more spasm and pushing things out (and irritates her hemorrhoids)  No depressed mood. Not anhedonic  Current Outpatient Prescriptions on File Prior to Visit  Medication Sig Dispense Refill  . hydrocortisone (ANUSOL-HC) 2.5 % rectal cream Place 1 application rectally 3 (three) times daily.  30 g  1  . levothyroxine (SYNTHROID, LEVOTHROID) 50 MCG tablet Take 1 tablet (50 mcg total) by mouth daily. BRAND NAME ONLY  30 tablet  11  . Minoxidil 5 % FOAM Apply topically daily.      . norgestimate-ethinyl estradiol (ORTHO-CYCLEN,SPRINTEC,PREVIFEM) 0.25-35 MG-MCG tablet Take 1 tablet by mouth daily. BRAND NAME ONLY  1 Package  11   No current facility-administered medications on file prior to visit.    Allergies  Allergen Reactions  . Aspirin     REACTION: nose bleeds    Past Medical History  Diagnosis Date  . GERD (gastroesophageal reflux disease)   . Hypothyroidism     Past Surgical History  Procedure Laterality Date  . Nasal septoplasty w/ turbinoplasty  06/1986    reduction  . Skin graft  11/2002    Nasal (for epistaxis)    Family History  Problem Relation Age of Onset  . Heart disease Father     atrial fib  . Cancer Father     colon cancer  .  Hypertension Father   . Heart disease Maternal Uncle     CAD  . Cancer Paternal Aunt     colon cancer    History   Social History  . Marital Status: Married    Spouse Name: N/A    Number of Children: 0  . Years of Education: N/A   Occupational History  . 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 Smoking status: Never Smoker   . Smokeless tobacco: Never Used  . Alcohol Use: No  . Drug Use: Not on file  . Sexual Activity: Not on file   Other Topics Concern  . Not on file   Social History Narrative   Regular exercise Yes   Review of Systems Sleeps well and feels rested. Then her mind starts going and she has the symptoms Her symptoms will improve if she takes deep breaths Hard to do yoga and pilates---time committements also. Likes her [place] music for relaxation.    Objective:   Physical Exam  Constitutional: She appears well-developed and well-nourished. No distress.  Neck: Normal range of motion. Neck supple. No thyromegaly present.  Cardiovascular: Normal rate, regular rhythm and normal heart sounds.  Exam reveals no gallop.   No murmur heard. Pulmonary/Chest: Effort normal and breath sounds normal. No respiratory distress. She has no wheezes. She has  no rales.  Abdominal: Soft. She exhibits no distension. There is no tenderness. There is no rebound and no guarding.  Lymphadenopathy:    She has no cervical adenopathy.  Psychiatric:  Mildly pressured but not depressed Normal appearance and speech          Assessment & Plan:

## 2014-06-01 ENCOUNTER — Telehealth: Payer: Self-pay

## 2014-06-01 NOTE — Telephone Encounter (Signed)
Discussed with her Doesn't sound like true allergic reaction at this point--but likely from the med Stay on the 1/2 dose for a few more days and increase if the sensation goes away. Stop the med for any swelling or trouble swallowing

## 2014-06-01 NOTE — Telephone Encounter (Signed)
Pt said since starting sertraline for the last 3 days pt has sensation in roof of mouth that goes back to throat as if novacaine feeling at dental procedure on and off; not real numbness, no trouble swallowing.No CP, SOB,H/A or dizziness.pt request cb. [facility].

## 2014-06-14 NOTE — Telephone Encounter (Signed)
Pt left v/m; pt has been on 1/2 dose of zoloft for 3 weeks; pt experiencing increased heart rate that pt thinks is related to zoloft, HR is in the 90s which is making it difficult for pt to sleep at night. Pt said experiencing anxiety and that had increased HR but during the last week pt has noticed more elevation of HR that is consistent. No CP,SOB. Occasionally when stressed has chest cramping in sternum area; pt has been experiencing chest cramping for awhile but episodes are more often. Dr Silvio Pate is out of office this afternoon and his CMA not sure if will be on computer or not; pt wants to know if she should stop zoloft now and not take in AM. Pt request cb[facility].

## 2014-06-14 NOTE — Telephone Encounter (Signed)
Spoke with patient and she will skip her dose tomorrow and wait to her from Dr. Silvio Pate, pt states she already took a dose this morning. Please advise

## 2014-06-14 NOTE — Telephone Encounter (Signed)
I would advise her to try taking it at night instead of morning. If elevated heart rate continues, take every other day x 1 week then stop.

## 2014-06-15 NOTE — Telephone Encounter (Signed)
It sounds like she should just stop the sertraline--she is not tolerating it. Have her stay off till next week  If she would like to try something else next week, send Rx for escitalopram 5mg  (#30 x 1). Should have follow up about 3 weeks after starting (probably have to change appt date)

## 2014-06-15 NOTE — Telephone Encounter (Signed)
Spoke with patient and advised results, she will call back when she want the new prescription.

## 2014-07-10 ENCOUNTER — Ambulatory Visit: Payer: BC Managed Care – PPO | Admitting: Internal Medicine

## 2014-08-21 ENCOUNTER — Other Ambulatory Visit: Payer: Self-pay | Admitting: *Deleted

## 2014-08-21 MED ORDER — LEVOTHYROXINE SODIUM 50 MCG PO TABS: 50.0000 ug | ORAL_TABLET | Freq: Every day | ORAL | Status: DC

## 2014-08-28 ENCOUNTER — Encounter: Payer: BC Managed Care – PPO | Admitting: Internal Medicine

## 2014-09-19 ENCOUNTER — Telehealth: Payer: Self-pay | Admitting: Internal Medicine

## 2014-09-19 NOTE — Telephone Encounter (Signed)
Left message on machine that I can possibly squeeze them in 11/27/13 @ 2:00 & 2:30, asked her to return my call.

## 2014-09-19 NOTE — Telephone Encounter (Signed)
Spoke with patient and scheduled appts for her husband

## 2014-09-19 NOTE — Telephone Encounter (Signed)
Pt had her CPE scheduled 11/9 but had to cancel due to her father being in ICU @ [facility] who did eventually pass away, but she is now requesting to reschedule that visit but needs to have the visit on the same day as her husband Ronda Rajkumar due to them both working in [place] and [place] and sharing a car. His visit was cancelled due to Dr Silvio Pate being out. They want to try to get scheduled on the same day, asap and back to back if possible. I told them that Dr Alla German first CPE was going to be in April but she is requesting that they be seen sooner.

## 2014-10-31 ENCOUNTER — Ambulatory Visit: Payer: Self-pay | Admitting: Obstetrics and Gynecology

## 2014-11-09 LAB — HM MAMMOGRAPHY: HM MAMMO: NORMAL

## 2014-11-27 ENCOUNTER — Ambulatory Visit (INDEPENDENT_AMBULATORY_CARE_PROVIDER_SITE_OTHER): Payer: BC Managed Care – PPO | Admitting: Internal Medicine

## 2014-11-27 ENCOUNTER — Encounter: Payer: Self-pay | Admitting: Internal Medicine

## 2014-11-27 VITALS — BP 100/66 | HR 87 | Temp 98.6°F | Ht 63.0 in | Wt 135.5 lb

## 2014-11-27 DIAGNOSIS — Z0000 Encounter for general adult medical examination without abnormal findings: Secondary | ICD-10-CM

## 2014-11-27 DIAGNOSIS — E039 Hypothyroidism, unspecified: Secondary | ICD-10-CM

## 2014-11-27 DIAGNOSIS — Z23 Encounter for immunization: Secondary | ICD-10-CM

## 2014-11-27 DIAGNOSIS — F418 Other specified anxiety disorders: Secondary | ICD-10-CM

## 2014-11-27 DIAGNOSIS — F419 Anxiety disorder, unspecified: Secondary | ICD-10-CM

## 2014-11-27 NOTE — Assessment & Plan Note (Signed)
Better now No meds for this

## 2014-11-27 NOTE — Addendum Note (Signed)
Addended by: Pete Pelt on: 11/27/2014 05:28 PM   Modules accepted: Orders

## 2014-11-27 NOTE — Progress Notes (Signed)
Subjective:    Patient ID: Amy Riley, female    DOB: July 07, 1966, 49 y.o.   MRN: [id number]  HPI Here for physical Still stressed with his dad's death and she is the executor Same job Took a long weekend for 49th birthday  Still sees Dr Enzo Bi He plans to try to get her off the birth control next year Plans to get colonoscopy at [facility] for their call  Now getting back to her exercise  Has missed a lot due to family responsibilities Weight is stable  Current Outpatient Prescriptions on File Prior to Visit  Medication Sig Dispense Refill  . hydrocortisone (ANUSOL-HC) 2.5 % rectal cream Place 1 application rectally 3 (three) times daily. (Patient taking differently: Place 1 application rectally 3 (three) times daily as needed. ) 30 g 1  . levothyroxine (SYNTHROID, LEVOTHROID) 50 MCG tablet Take 1 tablet (50 mcg total) by mouth daily. BRAND NAME ONLY 30 tablet 11  . Minoxidil 5 % FOAM Apply topically daily.    . norgestimate-ethinyl estradiol (ORTHO-CYCLEN,SPRINTEC,PREVIFEM) 0.25-35 MG-MCG tablet Take 1 tablet by mouth daily. BRAND NAME ONLY 1 Package 11  . sertraline (ZOLOFT) 50 MG tablet Take 1 tablet (50 mg total) by mouth daily. (Patient not taking: Reported on 11/27/2014) 30 tablet 3   No current facility-administered medications on file prior to visit.    Allergies  Allergen Reactions  . Aspirin     REACTION: nose bleeds    Past Medical History  Diagnosis Date  . GERD (gastroesophageal reflux disease)   . Hypothyroidism     Past Surgical History  Procedure Laterality Date  . Nasal septoplasty w/ turbinoplasty  06/1986    reduction  . Skin graft  11/2002    Nasal (for epistaxis)    Family History  Problem Relation Age of Onset  . Heart disease Father     atrial fib  . Cancer Father     colon cancer  . Hypertension Father   . Stroke Father   . Heart disease Maternal Uncle     CAD  . Cancer Paternal Aunt     colon cancer    History    Social History  . Marital Status: Married    Spouse Name: N/A    Number of Children: 0  . Years of Education: N/A   Occupational History  . 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 Smoking status: Never Smoker   . Smokeless tobacco: Never Used  . Alcohol Use: No  . Drug Use: Not on file  . Sexual Activity: Not on file   Other Topics Concern  . Not on file   Social History Narrative   Regular exercise Yes   Review of Systems  Constitutional: Negative for fatigue and unexpected weight change.       Wears seat belt  HENT: Positive for tinnitus. Negative for dental problem and hearing loss.        Regular with dentist  Eyes: Negative for photophobia and visual disturbance.  Respiratory: Negative for cough, chest tightness and shortness of breath.   Cardiovascular: Positive for palpitations and leg swelling. Negative for chest pain.       Rare skipped beats--nothing serious. May have slight pedal edema at the end of a day  Gastrointestinal: Negative for nausea, vomiting, abdominal pain, constipation and blood in stool.       Chronic hemorrhoids. Did worse with bowels on acidophilus  Endocrine: Negative for  polydipsia and polyuria.  Genitourinary: Negative for dysuria, hematuria, difficulty urinating and dyspareunia.  Musculoskeletal:       Mild back and hip pain  Skin: Negative for rash.  Allergic/Immunologic: Positive for environmental allergies. Negative for immunocompromised state.  Neurological: Positive for headaches. Negative for dizziness, syncope, weakness, light-headedness and numbness.       Did have eye migraine recently (aura) Sinus headaches at times Rare cephalic migraines  Hematological: Negative for adenopathy. Does not bruise/bleed easily.  Psychiatric/Behavioral: Negative for sleep disturbance and dysphoric mood. The patient is nervous/anxious.        Anxiety better ---has been off sertraline (didn't help)        Objective:   Physical Exam  Constitutional: She is oriented to person, place, and time. She appears well-developed and well-nourished. No distress.  HENT:  Head: Normocephalic and atraumatic.  Right Ear: External ear normal.  Left Ear: External ear normal.  Mouth/Throat: Oropharynx is clear and moist. No oropharyngeal exudate.  Eyes: Conjunctivae and EOM are normal. Pupils are equal, round, and reactive to light.  Neck: Normal range of motion. Neck supple. No thyromegaly present.  Cardiovascular: Normal rate, regular rhythm, normal heart sounds and intact distal pulses.  Exam reveals no gallop.   No murmur heard. Pulmonary/Chest: Effort normal and breath sounds normal. No respiratory distress. She has no wheezes. She has no rales.  Abdominal: Soft. There is no tenderness.  Musculoskeletal: She exhibits no edema or tenderness.  Lymphadenopathy:    She has no cervical adenopathy.  Neurological: She is alert and oriented to person, place, and time.  Skin: No rash noted. No erythema.  Psychiatric: She has a normal mood and affect. Her behavior is normal.          Assessment & Plan:

## 2014-11-27 NOTE — Assessment & Plan Note (Signed)
Fairly healthy Getting back to exercise Tdap today Will get colonoscopy at [facility] soon UTD on mammo and pap

## 2014-11-27 NOTE — Progress Notes (Signed)
Pre visit review using our clinic review tool, if applicable. No additional management support is needed unless otherwise documented below in the visit note. 

## 2014-11-27 NOTE — Assessment & Plan Note (Signed)
Appears to be euthyroid Will recheck Defer lipid levels this year

## 2014-11-28 LAB — COMPREHENSIVE METABOLIC PANEL
ALK PHOS: 50 U/L (ref 39–117)
ALT: 9 U/L (ref 0–35)
AST: 18 U/L (ref 0–37)
Albumin: 4 g/dL (ref 3.5–5.2)
BUN: 13 mg/dL (ref 6–23)
CO2: 27 mEq/L (ref 19–32)
Calcium: 9.2 mg/dL (ref 8.4–10.5)
Chloride: 104 mEq/L (ref 96–112)
Creatinine, Ser: 0.81 mg/dL (ref 0.40–1.20)
GFR: 79.55 mL/min (ref >60.00)
Glucose, Bld: 75 mg/dL (ref 70–99)
Potassium: 4.1 mEq/L (ref 3.5–5.1)
SODIUM: 136 meq/L (ref 135–145)
TOTAL PROTEIN: 7.5 g/dL (ref 6.0–8.3)
Total Bilirubin: 0.4 mg/dL (ref 0.2–1.2)

## 2014-11-28 LAB — CBC WITH DIFFERENTIAL/PLATELET
BASOS ABS: 0.1 10*3/uL (ref 0.0–0.1)
BASOS PCT: 0.6 % (ref 0.0–3.0)
EOS ABS: 0.2 10*3/uL (ref 0.0–0.7)
Eosinophils Relative: 1.7 % (ref 0.0–5.0)
HCT: 40.9 % (ref 36.0–46.0)
HEMOGLOBIN: 13.6 g/dL (ref 12.0–15.0)
Lymphocytes Relative: 20.1 % (ref 12.0–46.0)
Lymphs Abs: 2 10*3/uL (ref 0.7–4.0)
MCHC: 33.3 g/dL (ref 30.0–36.0)
MCV: 91.3 fl (ref 78.0–100.0)
Monocytes Absolute: 0.6 10*3/uL (ref 0.1–1.0)
Monocytes Relative: 5.9 % (ref 3.0–12.0)
NEUTROS ABS: 7.1 10*3/uL (ref 1.4–7.7)
Neutrophils Relative %: 71.7 % (ref 43.0–77.0)
Platelets: 209 10*3/uL (ref 150.0–400.0)
RBC: 4.48 Mil/uL (ref 3.87–5.11)
RDW: 13.6 % (ref 11.5–15.5)
WBC: 10 10*3/uL (ref 4.0–10.5)

## 2014-11-28 LAB — TSH: TSH: 6.7 u[IU]/mL — ABNORMAL HIGH (ref 0.35–4.50)

## 2014-11-28 LAB — T4, FREE: Free T4: 0.73 ng/dL (ref 0.60–1.60)

## 2015-01-18 ENCOUNTER — Ambulatory Visit (INDEPENDENT_AMBULATORY_CARE_PROVIDER_SITE_OTHER): Payer: BC Managed Care – PPO | Admitting: Internal Medicine

## 2015-01-18 ENCOUNTER — Telehealth: Payer: Self-pay | Admitting: Internal Medicine

## 2015-01-18 ENCOUNTER — Encounter: Payer: Self-pay | Admitting: Internal Medicine

## 2015-01-18 VITALS — BP 130/80 | HR 93 | Temp 98.3°F | Wt 135.0 lb

## 2015-01-18 DIAGNOSIS — K529 Noninfective gastroenteritis and colitis, unspecified: Secondary | ICD-10-CM | POA: Diagnosis not present

## 2015-01-18 NOTE — Telephone Encounter (Signed)
Pt stopped by on her way out to schedule CPE for 2/17. Only available times were 8:30-11:30. Pt works in [place] and needs a later appt. As late in the day as possible and is requesting to be scheduled outside of the available CPE times. Please advise.

## 2015-01-18 NOTE — Progress Notes (Signed)
   Subjective:    Patient ID: Amy Riley, female    DOB: [birth year], 49 y.o.   MRN: [id number]  HPI Here for follow up of ER visit for ?pancreatitis 3/6  She had tenderness in epigastrium which is not new for her Usually will get better with zantac Then didn't like smell of food but not nauseated Started to come home (was at the beach)--but then hit with hourly diarrhea Went to urgent care--given IV fluids and eventually sent home  Lipase mildly elevated  Has been fine since then Appetite is normal  Current Outpatient Prescriptions on File Prior to Visit  Medication Sig Dispense Refill  . hydrocortisone (ANUSOL-HC) 2.5 % rectal cream Place 1 application rectally 3 (three) times daily. (Patient taking differently: Place 1 application rectally 3 (three) times daily as needed. ) 30 g 1  . levothyroxine (SYNTHROID, LEVOTHROID) 50 MCG tablet Take 1 tablet (50 mcg total) by mouth daily. BRAND NAME ONLY 30 tablet 11  . Minoxidil 5 % FOAM Apply topically daily.    . norgestimate-ethinyl estradiol (ORTHO-CYCLEN,SPRINTEC,PREVIFEM) 0.25-35 MG-MCG tablet Take 1 tablet by mouth daily. BRAND NAME ONLY 1 Package 11   No current facility-administered medications on file prior to visit.    Allergies  Allergen Reactions  . Aspirin     REACTION: nose bleeds    Past Medical History  Diagnosis Date  . GERD (gastroesophageal reflux disease)   . Hypothyroidism     Past Surgical History  Procedure Laterality Date  . Nasal septoplasty w/ turbinoplasty  06/1986    reduction  . Skin graft  11/2002    Nasal (for epistaxis)    Family History  Problem Relation Age of Onset  . Heart disease Father     atrial fib  . Cancer Father     colon cancer  . Hypertension Father   . Stroke Father   . Heart disease Maternal Uncle     CAD  . Cancer Paternal Aunt     colon cancer    History   Social History  . Marital Status: Married    Spouse Name: N/A  . Number of Children: 0  . Years of  Education: N/A   Occupational History  . 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 Smoking status: Never Smoker   . Smokeless tobacco: Never Used  . Alcohol Use: No  . Drug Use: Not on file  . Sexual Activity: Not on file   Other Topics Concern  . Not on file   Social History Narrative   Regular exercise Yes   Review of Systems Bowels are changing over the years--but still regular. No N/V No fever    Objective:   Physical Exam  Constitutional: She appears well-developed and well-nourished. No distress.  Abdominal: Soft. She exhibits no distension. There is no tenderness. There is no rebound and no guarding.          Assessment & Plan:

## 2015-01-18 NOTE — Assessment & Plan Note (Signed)
Sounds like she had the virus that was going around Lipase was up to 559 --so might have had a secondary pancreatitis No stones on ultrasound No reason for further action now

## 2015-01-18 NOTE — Progress Notes (Signed)
Pre visit review using our clinic review tool, if applicable. No additional management support is needed unless otherwise documented below in the visit note. 

## 2015-01-19 NOTE — Telephone Encounter (Signed)
The only days he's here in the afternoon is Monday and Thursday, so anytime those days

## 2015-01-24 NOTE — Telephone Encounter (Signed)
Done

## 2015-04-19 ENCOUNTER — Telehealth: Payer: Self-pay | Admitting: Internal Medicine

## 2015-04-19 NOTE — Telephone Encounter (Signed)
Pt called back. She got an appointment this coming Tuesday 04/24/15 with Dr. Narda Bonds at [facility]. No referral necessary

## 2015-04-19 NOTE — Telephone Encounter (Signed)
Pt called. She has went to [facility] ER 2 times in the past 12 hours for upper right rib/side pain. She is requesting referral to [facility] GI. Pt states it is her gall bladder again. Notes are in care everywhere. Pt is in a lot of pain.  Please advise.  [phone]

## 2015-04-19 NOTE — Telephone Encounter (Signed)
Okay Will await word about the visit

## 2015-05-18 LAB — HM COLONOSCOPY: HM Colonoscopy: NORMAL

## 2015-08-28 ENCOUNTER — Other Ambulatory Visit: Payer: Self-pay | Admitting: Internal Medicine

## 2015-09-20 HISTORY — PX: CHOLECYSTECTOMY, LAPAROSCOPIC: SHX56

## 2015-09-26 ENCOUNTER — Encounter: Payer: Self-pay | Admitting: Internal Medicine

## 2015-10-31 ENCOUNTER — Encounter: Payer: Self-pay | Admitting: Obstetrics and Gynecology

## 2015-11-01 ENCOUNTER — Other Ambulatory Visit: Payer: Self-pay

## 2015-11-01 MED ORDER — NORGESTIMATE-ETH ESTRADIOL 0.25-35 MG-MCG PO TABS
1.0000 | ORAL_TABLET | Freq: Every day | ORAL | Status: DC
Start: 2015-11-01 — End: 2015-12-12

## 2015-11-20 ENCOUNTER — Ambulatory Visit (INDEPENDENT_AMBULATORY_CARE_PROVIDER_SITE_OTHER): Payer: BC Managed Care – PPO | Admitting: Obstetrics and Gynecology

## 2015-11-20 ENCOUNTER — Encounter: Payer: Self-pay | Admitting: Obstetrics and Gynecology

## 2015-11-20 VITALS — BP 118/67 | HR 93 | Ht 63.5 in | Wt 120.9 lb

## 2015-11-20 DIAGNOSIS — Z01419 Encounter for gynecological examination (general) (routine) without abnormal findings: Secondary | ICD-10-CM

## 2015-11-20 DIAGNOSIS — Z780 Asymptomatic menopausal state: Secondary | ICD-10-CM

## 2015-11-20 DIAGNOSIS — N9089 Other specified noninflammatory disorders of vulva and perineum: Secondary | ICD-10-CM | POA: Diagnosis not present

## 2015-11-20 DIAGNOSIS — Z1239 Encounter for other screening for malignant neoplasm of breast: Secondary | ICD-10-CM | POA: Diagnosis not present

## 2015-11-20 DIAGNOSIS — Z0000 Encounter for general adult medical examination without abnormal findings: Secondary | ICD-10-CM | POA: Diagnosis not present

## 2015-11-20 NOTE — Patient Instructions (Signed)
1.  No Pap smear is needed. 2.  Mammogram is ordered. 3.  Continue with healthy eating and exercise; maintain weight. 4.  No stool guaiac cards testing is necessary this year because of colonoscopy recently done. 5.  Return in 2 weeks for vulvar biopsy. 6.  Return in one year for annual exam

## 2015-11-20 NOTE — Progress Notes (Signed)
Patient ID: Amy Riley, female   DOB: [birth year], 50 y.o.   MRN: [id number] ANNUAL PREVENTATIVE CARE GYN  ENCOUNTER NOTE  Subjective:       Amy Riley is a 50 y.o. No obstetric history on file. female here for a routine annual gynecologic exam.  Current complaints: 1.  Possible come off ocp 2.     Off white area near groin   Patient had eventful year.  She is status post laparoscopic cholecystectomy.  8 weeks ago.  She is status post colonoscopy and EGD with significant findings being internal hemorrhoids only.  Concern today is for grayish white lesion on her perineum.  She is interested in coming off of oral contraceptives.   Gynecologic History Patient's last menstrual period was 10/28/2015 (approximate). Contraception: OCP (estrogen/progesterone) Last Pap: 08/2013 neg/neg. Results were: normal Last mammogram: 04/01/2015 . Results were: normal  Obstetric History OB History  No data available    Past Medical History  Diagnosis Date  . GERD (gastroesophageal reflux disease)   . Hypothyroidism     Past Surgical History  Procedure Laterality Date  . Nasal septoplasty w/ turbinoplasty  06/1986    reduction  . Skin graft  11/2002    Nasal (for epistaxis)  . Cholecystectomy, laparoscopic  12/16    [facility]    Current Outpatient Prescriptions on File Prior to Visit  Medication Sig Dispense Refill  . hydrocortisone (ANUSOL-HC) 2.5 % rectal cream Place 1 application rectally 3 (three) times daily. (Patient taking differently: Place 1 application rectally 3 (three) times daily as needed. ) 30 g 1  . norgestimate-ethinyl estradiol (ORTHO-CYCLEN,SPRINTEC,PREVIFEM) 0.25-35 MG-MCG tablet Take 1 tablet by mouth daily. BRAND NAME ONLY 1 Package 1  . omeprazole (PRILOSEC) 20 MG capsule Take 20 mg by mouth.    . SYNTHROID 50 MCG tablet TAKE ONE TABLET BY MOUTH EVERY MORNING ON AN EMPTY STOMACH FOR THYROID. 30 tablet 11   No current facility-administered medications on file prior to  visit.    Allergies  Allergen Reactions  . Acidophilus Other (See Comments)    Rectal spasms  . Aspirin     REACTION: nose bleeds  . Nsaids Other (See Comments)    bleeding    Social History   Social History  . Marital Status: Married    Spouse Name: N/A  . Number of Children: 0  . Years of Education: N/A   Occupational History  . 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 Smoking status: Never Smoker   . Smokeless tobacco: Never Used  . Alcohol Use: No  . Drug Use: No  . Sexual Activity: No   Other Topics Concern  . Not on file   Social History Narrative   Regular exercise Yes    Family History  Problem Relation Age of Onset  . Heart disease Father     atrial fib  . Cancer Father     colon cancer  . Hypertension Father   . Stroke Father   . Heart disease Maternal Uncle     CAD  . Cancer Paternal Aunt     colon cancer    The following portions of the patient's history were reviewed and updated as appropriate: allergies, current medications, past family history, past medical history, past social history, past surgical history and problem list.  Review of Systems ROS Review of Systems - General ROS: negative for - chills, fatigue, fever, hot flashes, night sweats, weight  gain or weight loss Psychological ROS: negative for - anxiety, decreased libido, depression, mood swings, physical abuse or sexual abuse Ophthalmic ROS: negative for - blurry vision, eye pain or loss of vision ENT ROS: negative for - headaches, hearing change, visual changes or vocal changes Allergy and Immunology ROS: negative for - hives, itchy/watery eyes or seasonal allergies Hematological and Lymphatic ROS: negative for - bleeding problems, bruising, swollen lymph nodes or weight loss Endocrine ROS: negative for - galactorrhea, hair pattern changes, hot flashes, malaise/lethargy, mood swings, palpitations, polydipsia/polyuria, skin changes,  temperature intolerance or unexpected weight changes Breast ROS: negative for - new or changing breast lumps or nipple discharge Respiratory ROS: negative for - cough or shortness of breath Cardiovascular ROS: negative for - chest pain, irregular heartbeat, palpitations or shortness of breath Gastrointestinal ROS: no abdominal pain, change in bowel habits, or black or bloody stools Genito-Urinary ROS: no dysuria, trouble voiding, or hematuria Musculoskeletal ROS: negative for - joint pain or joint stiffness Neurological ROS: negative for - bowel and bladder control changes Dermatological ROS: negative for rash and skin lesion changes   Objective:   BP 118/67 mmHg  Pulse 93  Ht 5' 3.5" (1.613 m)  Wt 120 lb 14.4 oz (54.84 kg)  BMI 21.08 kg/m2  LMP 10/28/2015 (Approximate) CONSTITUTIONAL: Well-developed, well-nourished female in no acute distress.  PSYCHIATRIC: Normal mood and affect. Normal behavior. Normal judgment and thought content. [place]: Alert and oriented to person, place, and time. Normal muscle tone coordination. No cranial nerve deficit noted. HENT:  Normocephalic, atraumatic, External right and left ear normal. Oropharynx is clear and moist EYES: Conjunctivae and EOM are normal. Pupils are equal, round, and reactive to light. No scleral icterus.  NECK: Normal range of motion, supple, no masses.  Normal thyroid.  SKIN: Skin is warm and dry. No rash noted. Not diaphoretic. No erythema. No pallor. CARDIOVASCULAR: Normal heart rate noted, regular rhythm, no murmur. RESPIRATORY: Clear to auscultation bilaterally. Effort and breath sounds normal, no problems with respiration noted. BREASTS: Symmetric in size. No masses, skin changes, nipple drainage, or lymphadenopathy. ABDOMEN: Soft, normal bowel sounds, no distention noted.  No tenderness, rebound or guarding.  BLADDER: Normal PELVIC:  External Genitalia: 5 mm grayish white lesion at posterior fourchette, right side  BUS:  Normal  Vagina: Normal  Cervix: Normal  Uterus: Normal; midplane, normal size, shape, mobile, nontender  Adnexa: Normal; nonpalpable and nontender  RV: internal exam not done due to recent colonoscopy and External Exam NormaI  MUSCULOSKELETAL: Normal range of motion. No tenderness.  No cyanosis, clubbing, or edema.  2+ distal pulses. LYMPHATIC: No Axillary, Supraclavicular, or Inguinal Adenopathy.    Assessment:   Annual gynecologic examination 50 y.o. Contraception: OCP (estrogen/progesterone) bmi- 21 Vulvar lesion, posterior fourchette, right sided 5 mm (grayish white) Possible menopause   Plan:  Pap: Not needed Mammogram: Ordered Stool Guaiac Testing:  recent colonoscopy- 04/2015 Labs: thru pcp Routine preventative health maintenance measures emphasized: Exercise/Diet/Weight control, Tobacco Warnings and Alcohol/Substance use risks Return in 2 weeks for a vulvar biopsy. Discontinue oral contraceptives; return in 6 weeks for [facility] testing Return to [place], CMA Brayton Mars, MD  Note: This dictation was prepared with Dragon dictation along with smaller phrase technology. Any transcriptional errors that result from this process are unintentional.

## 2015-11-21 ENCOUNTER — Encounter: Payer: Self-pay | Admitting: Obstetrics and Gynecology

## 2015-12-03 ENCOUNTER — Encounter: Payer: BC Managed Care – PPO | Admitting: Internal Medicine

## 2015-12-04 ENCOUNTER — Ambulatory Visit (INDEPENDENT_AMBULATORY_CARE_PROVIDER_SITE_OTHER): Payer: BC Managed Care – PPO | Admitting: Obstetrics and Gynecology

## 2015-12-04 ENCOUNTER — Encounter: Payer: Self-pay | Admitting: Obstetrics and Gynecology

## 2015-12-04 VITALS — BP 120/82 | HR 90 | Ht 63.5 in | Wt 119.3 lb

## 2015-12-04 DIAGNOSIS — N9089 Other specified noninflammatory disorders of vulva and perineum: Secondary | ICD-10-CM

## 2015-12-04 NOTE — Patient Instructions (Signed)

## 2015-12-04 NOTE — Progress Notes (Signed)
Patient ID: Amy Riley, female   DOB: 02-Feb-1966, 50 y.o.   MRN: [id number]  Chief complaint: 1.  Perineal lesion. 2.  Vulvar biopsy  OBJECTIVE: BP 120/82 mmHg  Pulse 90  Ht 5' 3.5" (1.613 m)  Wt 119 lb 4.8 oz (54.114 kg)  BMI 20.80 kg/m2  LMP 11/27/2015 PELVIC: External Genitalia: 5 mm grayish white lesion at posterior fourchette, right side; Just inferior is a 5 mm gray mole, circular, symmetric BUS: Normal  PROCEDURE: Vulvar biopsy 2. The peritoneum is prepped with Betadine solution.  1% lidocaine without epinephrine, 3 cc is injected locally for anesthetic.  3.5 mm punch biopsy 2 is taken of the 2 lesions.  3-0 Vicryl repeat suture, simple interrupted technique is used for closure.  Good hemostasis.  Procedure was well tolerated.  ASSESSMENT: 1.  Posterior fourchette lesion 2.  PLAN: 1.  Vulvar biopsy 2, completed. 2.  Local wound care, reviewed. 3.  Return in 1 week for follow-up  .Brayton Mars, MD  Note: This dictation was prepared with Dragon dictation along with smaller phrase technology. Any transcriptional errors that result from this process are unintentional.

## 2015-12-05 ENCOUNTER — Encounter: Payer: Self-pay | Admitting: Obstetrics and Gynecology

## 2015-12-05 NOTE — Addendum Note (Signed)
Addended by: Elouise Munroe on: 12/05/2015 08:41 AM   Modules accepted: Orders

## 2015-12-07 ENCOUNTER — Encounter: Payer: Self-pay | Admitting: Obstetrics and Gynecology

## 2015-12-12 ENCOUNTER — Encounter: Payer: Self-pay | Admitting: Obstetrics and Gynecology

## 2015-12-12 ENCOUNTER — Ambulatory Visit (INDEPENDENT_AMBULATORY_CARE_PROVIDER_SITE_OTHER): Payer: BC Managed Care – PPO | Admitting: Obstetrics and Gynecology

## 2015-12-12 ENCOUNTER — Ambulatory Visit: Payer: BC Managed Care – PPO | Admitting: Obstetrics and Gynecology

## 2015-12-12 VITALS — BP 116/80 | HR 92 | Ht 63.5 in | Wt 118.6 lb

## 2015-12-12 DIAGNOSIS — N900 Mild vulvar dysplasia: Secondary | ICD-10-CM

## 2015-12-12 LAB — PATHOLOGY

## 2015-12-12 NOTE — Progress Notes (Signed)
Chief complaint: 1. Follow-up on vulvar biopsies  Patient presents for follow-up on vulvar biopsies for wort-like lesion noted in the posterior fourchette. 2 punch biopsies were taken.  PATHOLOGY:    8d ago    PATH REPORT.SITE OF ORIGIN SPEC Comment   Comments: Material submitted:                    Marland Kitchen  INFERIOR FOURCHETTE     . Comment   Comments: Clinician provided ICD-10:  N90.89     PATH REPORT.FINAL DX SPEC Comment   Comments: Diagnosis:  INFERIOR FOURCHETTE:  LOW-GRADE SQUAMOUS INTRAEPITHELIAL LESION (VIN 1).  COMMENT:  Immunohistochemical stain for p16INK4a shows positivity in the  dysplastic epithelium. Immunohistochemical stain for Ki-67 shows  increased proliferative activity and supports the diagnosis of low grade  dysplasia.  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  CER/12/12/2015      SIGNED OUT BY: Comment   Comments: Electronically signed:                   .  Elwanda Brooklyn, MD, Pathologist             Newer results are available. Click to view them now.        8d ago    PATH REPORT.SITE OF ORIGIN SPEC Comment   Comments: Material submitted:                    Marland Kitchen  SUPERIOR FOURCHETTE     . Comment   Comments: Clinician provided ICD-10:  N90.89     PATH REPORT.FINAL DX SPEC Comment   Comments:  Diagnosis:  SUPERIOR FOURCHETTE:  LOW-GRADE SQUAMOUS INTRAEPITHELIAL LESION (VIN 1).  COMMENT:  Immunohistochemical stain for p16INK4a shows positivity in the  dysplastic epithelium. Immunohistochemical stain for Ki-67 shows  increased proliferative activity and supports the diagnosis of LOW GRADE  dysplasia.  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  CER/12/12/2015      SIGNED OUT BY: Comment   Comments: Electronically signed:                   .  Elwanda Brooklyn, MD, Pathologist          OBJECTIVE: BP 116/80 mmHg  Pulse 92  Ht 5' 3.5" (1.613 m)  Wt 118 lb 9.6 oz (53.797 kg)  BMI 20.68 kg/m2  LMP 11/27/2015 Pelvic exam: Posterior fourchette biopsy sites are healing well; suture present; no significant induration erythema or drainage  ASSESSMENT: 1. VIN 1-Posterior fourchette  PLAN: 1. Findings reviewed; questions answered 2. Return in 3 months for vulvar colposcopy 3. Literature on HPV and colposcopy given.  A total of 15 minutes were spent face-to-face with the patient during this encounter and over half of that time dealt with counseling and coordination of care.  Brayton Mars, MD  Note: This dictation was prepared with Dragon dictation along with smaller phrase technology. Any transcriptional errors that result from this process are unintentional.

## 2015-12-12 NOTE — Patient Instructions (Signed)
1. Return in 3 months for vulvar colposcopy. 2. Continue with local wound care to biopsy sites.  Colposcopy Colposcopy is a procedure to examine your cervix and vagina, or the area around the outside of your vagina, for abnormalities or signs of disease. The procedure is done using a lighted microscope called a colposcope. Tissue samples may be collected during the colposcopy if your health care provider finds any unusual cells. A colposcopy may be done if a woman has:  An abnormal Pap test. A Pap test is a medical test done to evaluate cells that are on the surface of the cervix.  A Pap test result that is suggestive of human papillomavirus (HPV). This virus can cause genital warts and is linked to the development of cervical cancer.  A sore on her cervix and the results of a Pap test were normal.  Genital warts on the cervix or in or around the outside of the vagina.  A mother who took the drug diethylstilbestrol (DES) while pregnant.  Painful intercourse.  Vaginal bleeding, especially after sexual intercourse. LET [facility] CARE PROVIDER KNOW ABOUT:  Any allergies you have.  All medicines you are taking, including vitamins, herbs, eye drops, creams, and over-the-counter medicines.  Previous problems you or members of your family have had with the use of anesthetics.  Any blood disorders you have.  Previous surgeries you have had.  Medical conditions you have. RISKS AND COMPLICATIONS Generally, a colposcopy is a safe procedure. However, as with any procedure, complications can occur. Possible complications include:  Bleeding.  Infection.  Missed lesions. BEFORE THE PROCEDURE   Tell your health care provider if you have your menstrual period. A colposcopy typically is not done during menstruation.  For 24 hours before the colposcopy, do not:  Douche.  Use tampons.  Use medicines, creams, or suppositories in the vagina.  Have sexual intercourse. PROCEDURE    During the procedure, you will be lying on your back with your feet in foot rests (stirrups). A warm metal or plastic instrument (speculum) will be placed in your vagina to keep it open and to allow the health care provider to see the cervix. The colposcope will be placed outside the vagina. It will be used to magnify and examine the cervix, vagina, and the area around the outside of the vagina. A small amount of liquid solution will be placed on the area that is to be viewed. This solution will make it easier to see the abnormal cells. Your health care provider will use tools to suck out mucus and cells from the canal of the cervix. Then he or she will record the location of the abnormal areas. If a biopsy is done during the procedure, a medicine will usually be given to numb the area (local anesthetic). You may feel mild pain or cramping while the biopsy is done. After the procedure, tissue samples collected during the biopsy will be sent to a lab for analysis. AFTER THE PROCEDURE  You will be given instructions on when to follow up with your health care provider for your test results. It is important to keep your appointment.   This information is not intended to replace advice given to you by your health care provider. Make sure you discuss any questions you have with your health care provider.   Document Released: 12/27/2002 Document Revised: 06/08/2013 Document Reviewed: 05/05/2013 Elsevier Interactive Patient Education 2016 [place]  Human Papillomavirus Human papillomavirus (  HPV) is the most common sexually transmitted infection (STI). It is easy to pass it from person to person (contagious). HPV can cause cervical cancer, anal cancer, and genital warts. The genital warts can be seen and felt. Also, there may be wartlike regions in the throat. HPV may not have any symptoms. It is possible to have HPV for a long time and not know it. You may pass HPV on to others  without knowing it.  HOME CARE   Take medicines as told by your doctor.  Use over-the-counter creams for itching as told by your doctor.  Keep all follow-up visits. Make sure to get Pap tests as told by your doctor.  Do not touch or scratch the warts.  Do not treat genital warts with medicines used for treating hand warts.  Do not have sex while you are getting treatment.  Do not douche or use tampons during treatment of HPV.  Tell your sex partner about your infection because he or she may also need treatment.  If you get pregnant, tell your doctor that you had HPV. Your doctor will watch your pregnancy closely. This is important to keep your baby safe.  After treatment, use condoms during sex to prevent future infections.  Have only one sex partner.  Have a sex partner who does not have other sex partners. GET HELP IF:   The treated skin is red, swollen, or painful.  You have a fever.  You feel ill.  You feel lumps or pimple-like areas in and around your genital area.  You have bleeding of the vagina or the area that was treated.  You have pain during sex. MAKE SURE YOU:  Understand these instructions.  Will watch your condition.  Will get help if you are not doing well or get worse.   This information is not intended to replace advice given to you by your health care provider. Make sure you discuss any questions you have with your health care provider.   Document Released: 09/18/2008 Document Revised: 10/27/2014 Document Reviewed: 01/11/2014 Elsevier Interactive Patient Education [employer].

## 2015-12-14 ENCOUNTER — Ambulatory Visit (INDEPENDENT_AMBULATORY_CARE_PROVIDER_SITE_OTHER): Payer: BC Managed Care – PPO | Admitting: Internal Medicine

## 2015-12-14 ENCOUNTER — Encounter: Payer: Self-pay | Admitting: Internal Medicine

## 2015-12-14 VITALS — BP 120/72 | HR 83 | Temp 97.7°F | Ht 63.25 in | Wt 117.5 lb

## 2015-12-14 DIAGNOSIS — Z0000 Encounter for general adult medical examination without abnormal findings: Secondary | ICD-10-CM | POA: Diagnosis not present

## 2015-12-14 DIAGNOSIS — K219 Gastro-esophageal reflux disease without esophagitis: Secondary | ICD-10-CM

## 2015-12-14 DIAGNOSIS — E039 Hypothyroidism, unspecified: Secondary | ICD-10-CM

## 2015-12-14 LAB — T4, FREE: Free T4: 1.09 ng/dL (ref 0.60–1.60)

## 2015-12-14 LAB — TSH: TSH: 2.04 u[IU]/mL (ref 0.35–4.50)

## 2015-12-14 NOTE — Progress Notes (Signed)
Subjective:    Patient ID: Amy Riley, female    DOB: [birth year], 50 y.o.   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 hydrocortisone (ANUSOL-HC) 2.5 % rectal cream Place 1 application rectally 3 (three) times daily. (Patient taking differently: Place 1 application rectally 3 (three) times daily as needed. ) 30 g 1  . Hyoscyamine Sulfate (HYOSCYAMINE PO) Take by mouth daily. Take 1 tablet daily    . SYNTHROID 50 MCG tablet TAKE ONE TABLET BY MOUTH EVERY MORNING ON AN EMPTY STOMACH FOR THYROID. 30 tablet 11   No current facility-administered medications on file prior to visit.    Allergies  Allergen Reactions  . Acidophilus Other (See Comments)    Rectal spasms  . Aspirin     REACTION: nose bleeds  . Nsaids Other (See Comments)    bleeding    Past Medical History  Diagnosis Date  . GERD (gastroesophageal reflux disease)   . Hypothyroidism     Past Surgical History  Procedure Laterality Date  . Nasal septoplasty w/ turbinoplasty  06/1986    reduction  . Skin graft  11/2002    Nasal (for epistaxis)  . Cholecystectomy, laparoscopic  12/16    [facility]    Family History  Problem Relation Age of Onset  . Heart disease Father     atrial fib  . Cancer Father     colon cancer  . Hypertension Father   . Stroke Father   . Heart disease Maternal Uncle     CAD  . Cancer Paternal Aunt     colon cancer    Social History   Social History  . Marital Status: Married    Spouse Name: N/A  . Number of Children: 0  . Years of Education: N/A   Occupational History  .  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 Smoking status: Never Smoker   . Smokeless tobacco: Never Used  . Alcohol Use: No  . Drug Use: No  . Sexual Activity: No   Other Topics Concern  . Not on file   Social History Narrative   Regular exercise Yes   Review of Systems  Constitutional: Negative for fatigue.       Wears seat belt  HENT: Positive for tinnitus. Negative for dental problem, hearing loss and trouble swallowing.        Keeps up with dentist  Eyes: Negative for visual disturbance.       No diplopia or unilateral vision loss  Respiratory: Negative for cough and shortness of breath.        Rare chest tightness with stress  Cardiovascular: Positive for palpitations. Negative for leg swelling.  Gastrointestinal: Negative for nausea, abdominal pain, constipation and blood in stool.       Hasn't needed omeprazole since chole  Endocrine: Negative for polydipsia and polyuria.  Genitourinary: Negative for difficulty urinating and dyspareunia.       Periods still regular  Did go off OCP--so waiting to see what will happen  Musculoskeletal: Negative for back pain,  joint swelling and arthralgias.  Skin:       No suspicious lesions Occasional rash on back  Allergic/Immunologic: Positive for environmental allergies. Negative for immunocompromised state.       Hasn't needed meds recently  Neurological: Negative for dizziness, syncope, weakness, light-headedness and headaches.  Hematological: Negative for adenopathy. Bruises/bleeds easily.  Psychiatric/Behavioral: Negative for sleep disturbance and dysphoric mood. The patient is nervous/anxious.        Objective:   Physical Exam  Constitutional: Amy Riley is oriented to person, place, and time. Amy Riley appears well-developed and well-nourished. No distress.  HENT:  Head: Normocephalic and atraumatic.  Right Ear: External ear normal.  Left Ear: External ear normal.  Mouth/Throat: Oropharynx is  clear and moist. No oropharyngeal exudate.  Eyes: Conjunctivae are normal. Pupils are equal, round, and reactive to light.  Neck: Normal range of motion. Neck supple. No thyromegaly present.  Cardiovascular: Normal rate, regular rhythm, normal heart sounds and intact distal pulses.  Exam reveals no gallop.   No murmur heard. Pulmonary/Chest: Effort normal and breath sounds normal. No respiratory distress. Amy Riley has no wheezes. Amy Riley has no rales.  Abdominal: Soft. There is no tenderness.  Musculoskeletal: Amy Riley exhibits no edema or tenderness.  Lymphadenopathy:    Amy Riley has no cervical adenopathy.  Neurological: Amy Riley is alert and oriented to person, place, and time.  Skin: No rash noted. No erythema.  Psychiatric: Amy Riley has a normal mood and affect. Her behavior is normal.          Assessment & Plan:

## 2015-12-14 NOTE — Assessment & Plan Note (Signed)
Healthy Will be getting back to exercise Colon due 2026 Getting mammo soon Sees GYN

## 2015-12-14 NOTE — Progress Notes (Signed)
Pre visit review using our clinic review tool, if applicable. No additional management support is needed unless otherwise documented below in the visit note. 

## 2015-12-14 NOTE — Assessment & Plan Note (Signed)
Better since cholecystectomy No longer using PPI

## 2015-12-14 NOTE — Assessment & Plan Note (Signed)
Seems to be euthyroid BW due

## 2016-01-01 ENCOUNTER — Other Ambulatory Visit: Payer: BC Managed Care – PPO

## 2016-01-02 ENCOUNTER — Encounter: Payer: Self-pay | Admitting: Obstetrics and Gynecology

## 2016-01-02 LAB — FOLLICLE STIMULATING HORMONE: FSH: 69.8 m[IU]/mL

## 2016-01-28 ENCOUNTER — Ambulatory Visit
Admission: RE | Admit: 2016-01-28 | Discharge: 2016-01-28 | Disposition: A | Discharge status: Home / Self Care | Payer: BC Managed Care – PPO | Source: Ambulatory Visit | Attending: Obstetrics and Gynecology | Admitting: Obstetrics and Gynecology

## 2016-01-28 DIAGNOSIS — Z1239 Encounter for other screening for malignant neoplasm of breast: Secondary | ICD-10-CM | POA: Diagnosis not present

## 2016-01-28 DIAGNOSIS — Z1231 Encounter for screening mammogram for malignant neoplasm of breast: Secondary | ICD-10-CM | POA: Diagnosis present

## 2016-01-28 HISTORY — DX: Malignant (primary) neoplasm, unspecified: C80.1

## 2016-03-06 ENCOUNTER — Encounter: Payer: Self-pay | Admitting: Obstetrics and Gynecology

## 2016-03-06 ENCOUNTER — Ambulatory Visit (INDEPENDENT_AMBULATORY_CARE_PROVIDER_SITE_OTHER): Payer: BC Managed Care – PPO | Admitting: Obstetrics and Gynecology

## 2016-03-06 VITALS — BP 109/66 | HR 85 | Ht 63.25 in | Wt 115.6 lb

## 2016-03-06 DIAGNOSIS — N900 Mild vulvar dysplasia: Secondary | ICD-10-CM

## 2016-03-06 NOTE — Progress Notes (Signed)
Chief complaint: 1. 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  She does not report any vulvar itching or burning symptomatology   OBJECTIVE: BP 109/66 mmHg  Pulse 85  Ht 5' 3.25" (1.607 m)  Wt 115 lb 9.6 oz (52.436 kg)  BMI 20.30 kg/m2  LMP 11/21/2015 (Approximate)  Pelvic exam:  external genitalia- normal appearance BUS-normal  vagina- not examined    PROCEDURE: vulvar colposcopy   Description: the vulvar is soaked with acetic acid 4 x 4 containing sponges. Colposcopy demonstrates   A normal-appearing posterior fourchette and labia majora and minora. Just to the right of midline and the posterior fourchette is a 3 mm vesicle without acetowhite characteristics. (If persisting  At next Colposcopy, we will biopsy). Small urethral caruncle is present. No biopsies are taken.  ASSESSMENT: 1. 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 Normal colposcopy today without evidence of dysplasia  PLAN: 1. Return in 6 months for repeat follow-up colposcopy  Brayton Mars, MD  Note: This dictation was prepared with Dragon dictation along with smaller phrase technology. Any transcriptional errors that result from this process are unintentional.

## 2016-04-01 ENCOUNTER — Encounter: Payer: Self-pay | Admitting: Internal Medicine

## 2016-04-22 ENCOUNTER — Encounter: Payer: Self-pay | Admitting: Internal Medicine

## 2016-05-08 ENCOUNTER — Encounter: Payer: Self-pay | Admitting: Internal Medicine

## 2016-09-04 ENCOUNTER — Encounter: Payer: Self-pay | Admitting: Obstetrics and Gynecology

## 2016-09-04 ENCOUNTER — Ambulatory Visit (INDEPENDENT_AMBULATORY_CARE_PROVIDER_SITE_OTHER): Payer: BC Managed Care – PPO | Admitting: Obstetrics and Gynecology

## 2016-09-04 VITALS — BP 117/68 | HR 85 | Ht 63.25 in | Wt 115.1 lb

## 2016-09-04 DIAGNOSIS — N900 Mild vulvar dysplasia: Secondary | ICD-10-CM | POA: Diagnosis not present

## 2016-09-04 NOTE — Addendum Note (Signed)
Addended by: Elouise Munroe on: 09/04/2016 05:02 PM   Modules accepted: Orders

## 2016-09-04 NOTE — Patient Instructions (Addendum)
   VULVAR BIOPSY POST-PROCEDURE INSTRUCTIONS  1. You may take Ibuprofen, Aleve or Tylenol for pain if needed.    2. You may have a small amount of spotting.  You should wear a mini pad for the next few days.  3. You may use some topical Neosporin ointment if you would like (over the counter is fine).  4. You need to call if you have redness around the biopsy site, if there is any unusual draining, if the bleeding is heavy, or if you are concerned.  5. Shower or bathe as normal  We will discuss the results at your follow-up appointment if needed.

## 2016-09-04 NOTE — Progress Notes (Signed)
Chief complaint: 1. Vulvar colposcopy 2. 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 Right perianal   2. Right labia majora  Description: Patient consents verbally for colposcopy with biopsies. She gets positioned in the dorsal lithotomy position. 4 x 4 soaked gauze pads to the catheter placed over the vulva for 2 minutes. Colposcopy of the vulva reveals the above-noted findings. 3.5 mm punch biopsy is taken of the right perianal region and right labia majora following injection of 1% lidocaine without epinephrine (several cc at each site). 3-0 Vicryl repeat suture is used to place a single simple interrupted stitch for hemostasis. Procedure was well-tolerated. Blood loss is minimal. Complications are none.  ASSESSMENT: 1. History of VIN 1, status post excisional biopsy at posterior fourchette 2. 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 Biopsy 2 as noted 2. Local wound care instructions given 3. Return in 2 weeks for follow-up and further management planning  Amy Mars, MD  Note: This dictation was prepared with Dragon dictation along with smaller phrase technology. Any transcriptional errors that result from this process are unintentional.

## 2016-09-09 LAB — PATHOLOGY

## 2016-09-15 ENCOUNTER — Other Ambulatory Visit: Payer: Self-pay | Admitting: Internal Medicine

## 2016-09-17 ENCOUNTER — Ambulatory Visit (INDEPENDENT_AMBULATORY_CARE_PROVIDER_SITE_OTHER): Payer: BC Managed Care – PPO | Admitting: Obstetrics and Gynecology

## 2016-09-17 ENCOUNTER — Encounter: Payer: Self-pay | Admitting: Obstetrics and Gynecology

## 2016-09-17 VITALS — BP 108/68 | HR 90 | Ht 63.25 in | Wt 115.1 lb

## 2016-09-17 DIAGNOSIS — N900 Mild vulvar dysplasia: Secondary | ICD-10-CM | POA: Diagnosis not present

## 2016-09-17 NOTE — Patient Instructions (Signed)
1. Return in 3 months for colposcopy of vulva with possible biopsies 2. No need for surgical treatment of VIN 1 at this time

## 2016-09-19 NOTE — Progress Notes (Signed)
Chief complaint: 1. History of VIN 1 2. Follow-up on colposcopic directed biopsies   Colposcopy on 09/04/2016 demonstrated diffuse changes of acetowhite epithelium and HPV effect on the labia majora, left labia majora, and perianal region. Representative biopsies demonstrated  LGSIL lesion-VIN-I.  The patient is asymptomatic at this time without vulvar itching, burning, or pain.  Previous biopsies were also consistent with VIN-I.  OBJECTIVE: BP 108/68   Pulse 90   Ht 5' 3.25" (1.607 m)   Wt 115 lb 1.6 oz (52.2 kg)   LMP 11/21/2015 (Exact Date)   BMI 20.23 kg/m  Pelvic: External genitalia-normal healing biopsy sites without evidence of induration, ulceration, or pain  ASSESSMENT: 1. 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 Patient remains asymptomatic  PLAN: 1. Return in 3 months for follow-up colposcopy 2. Return sooner if symptoms of vulvar itching burning or pain developed 3. No need for laser therapy of vulvar disease at this time  A total of 15 minutes were spent face-to-face with the patient during this encounter and over half of that time dealt with counseling and coordination of care.  Brayton Mars, MD  Note: This dictation was prepared with Dragon dictation along with smaller phrase technology. Any transcriptional errors that result from this process are unintentional.

## 2016-09-22 ENCOUNTER — Encounter: Payer: Self-pay | Admitting: Obstetrics and Gynecology

## 2016-11-25 ENCOUNTER — Encounter: Payer: BC Managed Care – PPO | Admitting: Obstetrics and Gynecology

## 2016-12-10 ENCOUNTER — Ambulatory Visit (INDEPENDENT_AMBULATORY_CARE_PROVIDER_SITE_OTHER): Payer: BC Managed Care – PPO | Admitting: Obstetrics and Gynecology

## 2016-12-10 ENCOUNTER — Encounter: Payer: Self-pay | Admitting: Obstetrics and Gynecology

## 2016-12-10 VITALS — BP 103/69 | HR 90 | Ht 63.5 in | Wt 116.9 lb

## 2016-12-10 DIAGNOSIS — N900 Mild vulvar dysplasia: Secondary | ICD-10-CM

## 2016-12-10 NOTE — Progress Notes (Signed)
Chief complaint: 1.history of VIN 1 2. 3 month interval colposcopy  Patient reports no vulvar itching, burning, or pain.  Vulvar pathology history: 12/04/2015 posterior fourchette, right side, 5 mm grayish white lesion, 5 mm Gray mole  Posterior fourchette , inferior-VIN-I   Posterior fourchette, superior-VIN-I 03/06/2016 colposcopy, vulvar-normal (no biopsies) 09/04/2016 colposcopy, vulvar-idffuse white epithelium/papillation affect right labia majora, partially on left labia majora, bilateral perianal region, and faint acetowhite epithelium on labia minora bilaterally  Perianal vulvar, right-benign skin tissue with hyperkeratosis parakeratosis and chronic inflammation  Labia majora, rightVIN-I/flat condyloma/HPV effect 12/10/2016 colposcopy, vulvar-faint acetowhite epithelium at the posterior fourchette only (see geographic mapping diagram)  OBJECTIVE: BP 103/69   Pulse 90   Ht 5' 3.5" (1.613 m)   Wt 116 lb 14.4 oz (53 kg)   LMP  (LMP Unknown)   BMI 20.38 kg/m  Well-appearing female in no acute distress Pelvic: External genitalia-normal BUS-urethral caruncle Vagina-normal  PROCEDURE: Colposcopy of vulva Description: Consent is obtained, verbal.Patient is placed in dorsal lithotomy position. Acetic acid soaked 4 x 4's are placed on the perineum for several minutes. Following removal colposcopy demonstrates faint 4 mm acetowhite lesions (3) at the posterior fourchette in an "IC" configuration. No biopsies. Procedure was well-tolerated.  ASSESSMENT: 1. History of VIN 1 2. Asymptomatic 3. Vulvar colposcopy notable for small acetowhite lesions at the posterior fourchette, not biopsied  PLAN: 1. Return in 6 months for repeat vulvar colposcopy plus biopsy if the lesions persist.  Brayton Mars, MD  Note: This dictation was prepared with Dragon dictation along with smaller phrase technology. Any transcriptional errors that result from this process are  unintentional.

## 2016-12-10 NOTE — Patient Instructions (Signed)
1. Return in 6 months for colposcopy of the vulva

## 2016-12-15 ENCOUNTER — Encounter: Payer: Self-pay | Admitting: Obstetrics and Gynecology

## 2016-12-19 ENCOUNTER — Encounter: Payer: BC Managed Care – PPO | Admitting: Internal Medicine

## 2017-01-06 ENCOUNTER — Other Ambulatory Visit: Payer: Self-pay | Admitting: Internal Medicine

## 2017-01-26 ENCOUNTER — Ambulatory Visit (INDEPENDENT_AMBULATORY_CARE_PROVIDER_SITE_OTHER): Payer: BC Managed Care – PPO | Admitting: Internal Medicine

## 2017-01-26 ENCOUNTER — Encounter: Payer: Self-pay | Admitting: Internal Medicine

## 2017-01-26 VITALS — BP 108/80 | HR 76 | Temp 98.1°F | Ht 63.0 in | Wt 117.0 lb

## 2017-01-26 DIAGNOSIS — E039 Hypothyroidism, unspecified: Secondary | ICD-10-CM

## 2017-01-26 DIAGNOSIS — F418 Other specified anxiety disorders: Secondary | ICD-10-CM | POA: Diagnosis not present

## 2017-01-26 DIAGNOSIS — Z0000 Encounter for general adult medical examination without abnormal findings: Secondary | ICD-10-CM

## 2017-01-26 MED ORDER — LEVOTHYROXINE SODIUM 50 MCG PO TABS: ORAL_TABLET | ORAL | 3 refills | Status: DC

## 2017-01-26 NOTE — Assessment & Plan Note (Signed)
Still stressed with her medical conditions, stressful work and helping her husband with his medical issues

## 2017-01-26 NOTE — Assessment & Plan Note (Signed)
Healthy Chronic health issues that have affected her fitness Keeps up with gyn Recent colonoscopy UTD on vaccines

## 2017-01-26 NOTE — Assessment & Plan Note (Signed)
Seems euthyroid ?Will check labs ?

## 2017-01-26 NOTE — Progress Notes (Signed)
Pre visit review using our clinic review tool, if applicable. No additional management support is needed unless otherwise documented below in the visit note. 

## 2017-01-26 NOTE — Progress Notes (Signed)
Subjective:    Patient ID: Amy Riley, female    DOB: Jan 10, 1966, 51 y.o.   MRN: [id number]  HPI Here for physical  Really tired Work is very stressful  Having right shoulder problems Getting MRI  Having ongoing problems with her bowels Saw GI at [facility] Diagnosed with bacterial overgrowth Given xifaxan for 2 weeks Now on doxy for 30 days due to ongoing symptoms Lost considerable weight since the gall bladder surgery Eating healthy--but gets full quickly  Hasn't had thyroid checked No sig change in skin, hair or nails Some hot flashes---periods gone for over a year Tries to exercise---but so much life stress in past years, now shoulder problems, etc  Current Outpatient Prescriptions on File Prior to Visit  Medication Sig Dispense Refill  . SYNTHROID 50 MCG tablet TAKE 1 TABLET BY MOUTH ONCE EVERY MORNING ON EMPTY STOMACH 30 tablet 1   No current facility-administered medications on file prior to visit.     Allergies  Allergen Reactions  . Acidophilus Other (See Comments)    Rectal spasms  . Aspirin     REACTION: nose bleeds  . Cefdinir Other (See Comments)  . Cephalosporins Other (See Comments)  . Nsaids Other (See Comments)    bleeding  . Tolmetin     Other reaction(s): Other (See Comments) bleeding    Past Medical History:  Diagnosis Date  . Cancer ([place])    basal cell  . GERD (gastroesophageal reflux disease)   . Hypothyroidism     Past Surgical History:  Procedure Laterality Date  . CHOLECYSTECTOMY, LAPAROSCOPIC  12/16   [facility]  . NASAL SEPTOPLASTY W/ TURBINOPLASTY  06/1986   reduction  . SKIN GRAFT  11/2002   Nasal (for epistaxis)    Family History  Problem Relation Age of Onset  . Heart disease Father     atrial fib  . Cancer Father     colon cancer  . Hypertension Father   . Stroke Father   . Heart disease Maternal Uncle     CAD  . Cancer Paternal Aunt     colon cancer    Social History   Social History  . Marital status: Married     Spouse name: N/A  . Number of children: 0  . Years of education: N/A   Occupational History  . 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 Smoking status: Never Smoker  . Smokeless tobacco: Never Used  . Alcohol use No  . Drug use: No  . Sexual activity: No   Other Topics Concern  . Not on file   Social History Narrative   Regular exercise Yes   Review of Systems  Constitutional: Negative for fatigue and unexpected weight change.       Wears seat belt  HENT: Positive for tinnitus. Negative for dental problem and hearing loss.        Keeps up with dentist  Eyes: Negative for visual disturbance.       No diplopia or unilateral vision loss  Respiratory: Negative for cough, chest tightness and shortness of breath.   Cardiovascular: Positive for palpitations. Negative for leg swelling.       Gets substernal pain with anxiety (seems related to digestion--better with the antibiotic)  Gastrointestinal: Positive for abdominal pain and diarrhea. Negative for blood in stool.  Endocrine: Negative for polydipsia and polyuria.  Genitourinary: Negative for dyspareunia, dysuria and hematuria.  Musculoskeletal: Positive for arthralgias and  back pain. Negative for joint swelling.       Various stiff joints--especially in AM Shoulders bad now  Skin: Negative for rash.  Allergic/Immunologic: Positive for environmental allergies. Negative for immunocompromised state.       Hasn't needed meds  Neurological: Negative for dizziness, syncope, light-headedness and headaches.  Hematological: Negative for adenopathy. Does not bruise/bleed easily.  Psychiatric/Behavioral: Negative for dysphoric mood and sleep disturbance. The patient is nervous/anxious.        Objective:   Physical Exam  Constitutional: She is oriented to person, place, and time. She appears well-developed and well-nourished. No distress.  HENT:  Head: Normocephalic and atraumatic.    Right Ear: External ear normal.  Left Ear: External ear normal.  Mouth/Throat: Oropharynx is clear and moist. No oropharyngeal exudate.  Eyes: Conjunctivae are normal. Pupils are equal, round, and reactive to light.  Neck: Normal range of motion. No thyromegaly present.  Cardiovascular: Normal rate, regular rhythm, normal heart sounds and intact distal pulses.  Exam reveals no gallop.   No murmur heard. Pulmonary/Chest: Effort normal and breath sounds normal. No respiratory distress. She has no wheezes. She has no rales.  Abdominal: Soft. There is no tenderness.  Musculoskeletal: She exhibits no edema or tenderness.  Lymphadenopathy:    She has no cervical adenopathy.  Neurological: She is alert and oriented to person, place, and time.  Skin: No rash noted. No erythema.  Psychiatric: She has a normal mood and affect. Her behavior is normal.          Assessment & Plan:

## 2017-01-27 LAB — T4, FREE: Free T4: 0.82 ng/dL (ref 0.60–1.60)

## 2017-01-27 LAB — TSH: TSH: 6.8 u[IU]/mL — ABNORMAL HIGH (ref 0.35–4.50)

## 2017-01-30 ENCOUNTER — Encounter: Payer: Self-pay | Admitting: Obstetrics and Gynecology

## 2017-01-30 IMAGING — MG MM DIGITAL SCREENING BILAT W/ CAD
1 series · 4 of 4 positions shown · non-contrast
Comparison: Previous exam(s).

CLINICAL DATA: Screening.

EXAM:
DIGITAL SCREENING BILATERAL MAMMOGRAM WITH CAD

[R CC · right · 4 of 4 slices shown]
[im 1/4]
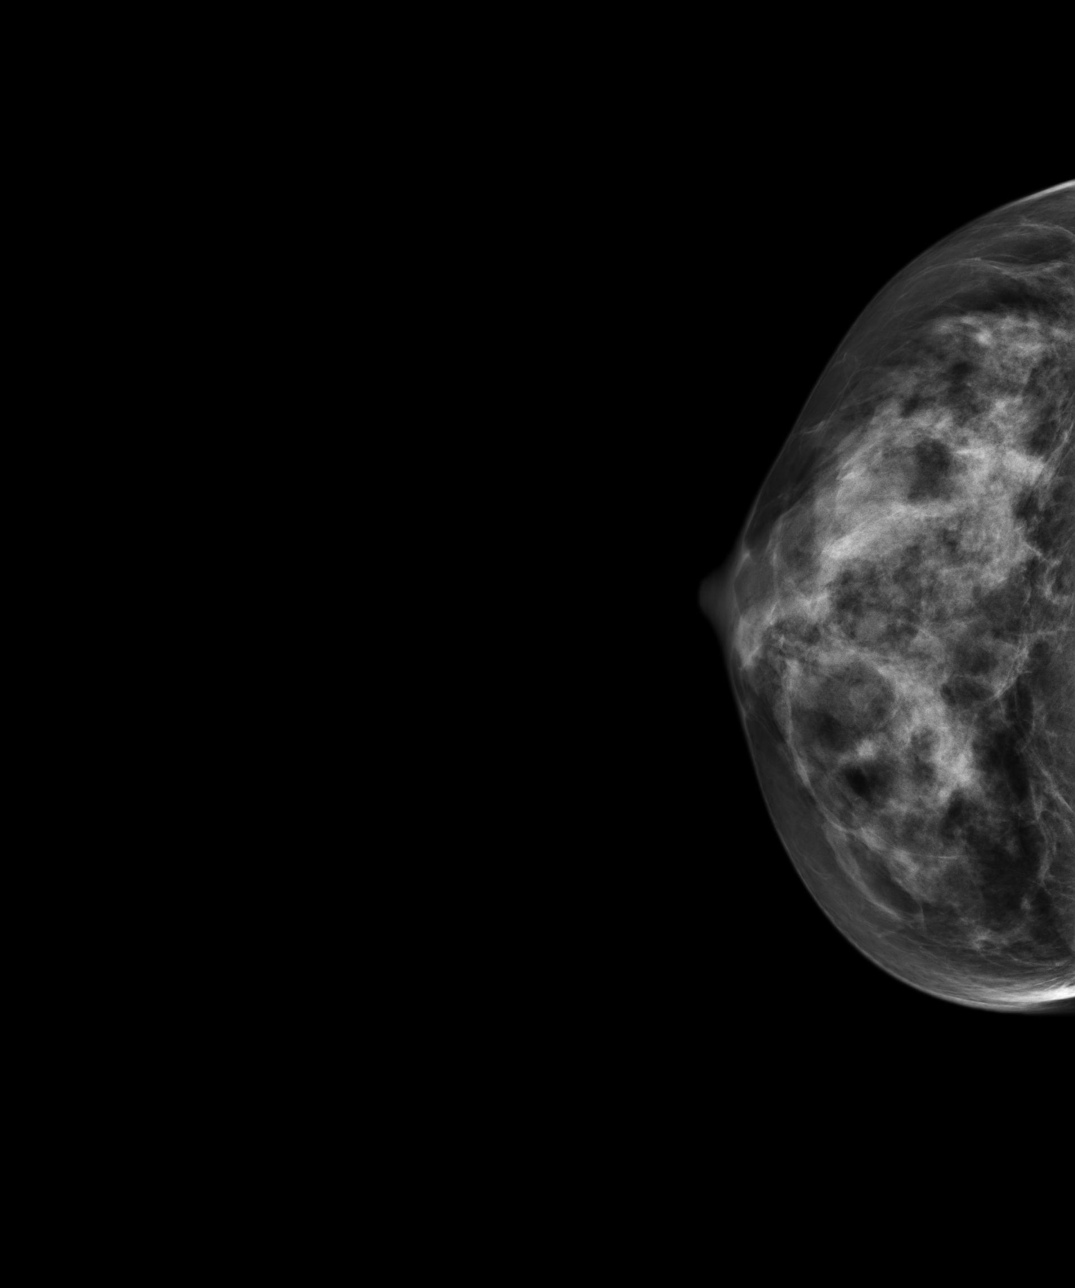
[im 2/4]
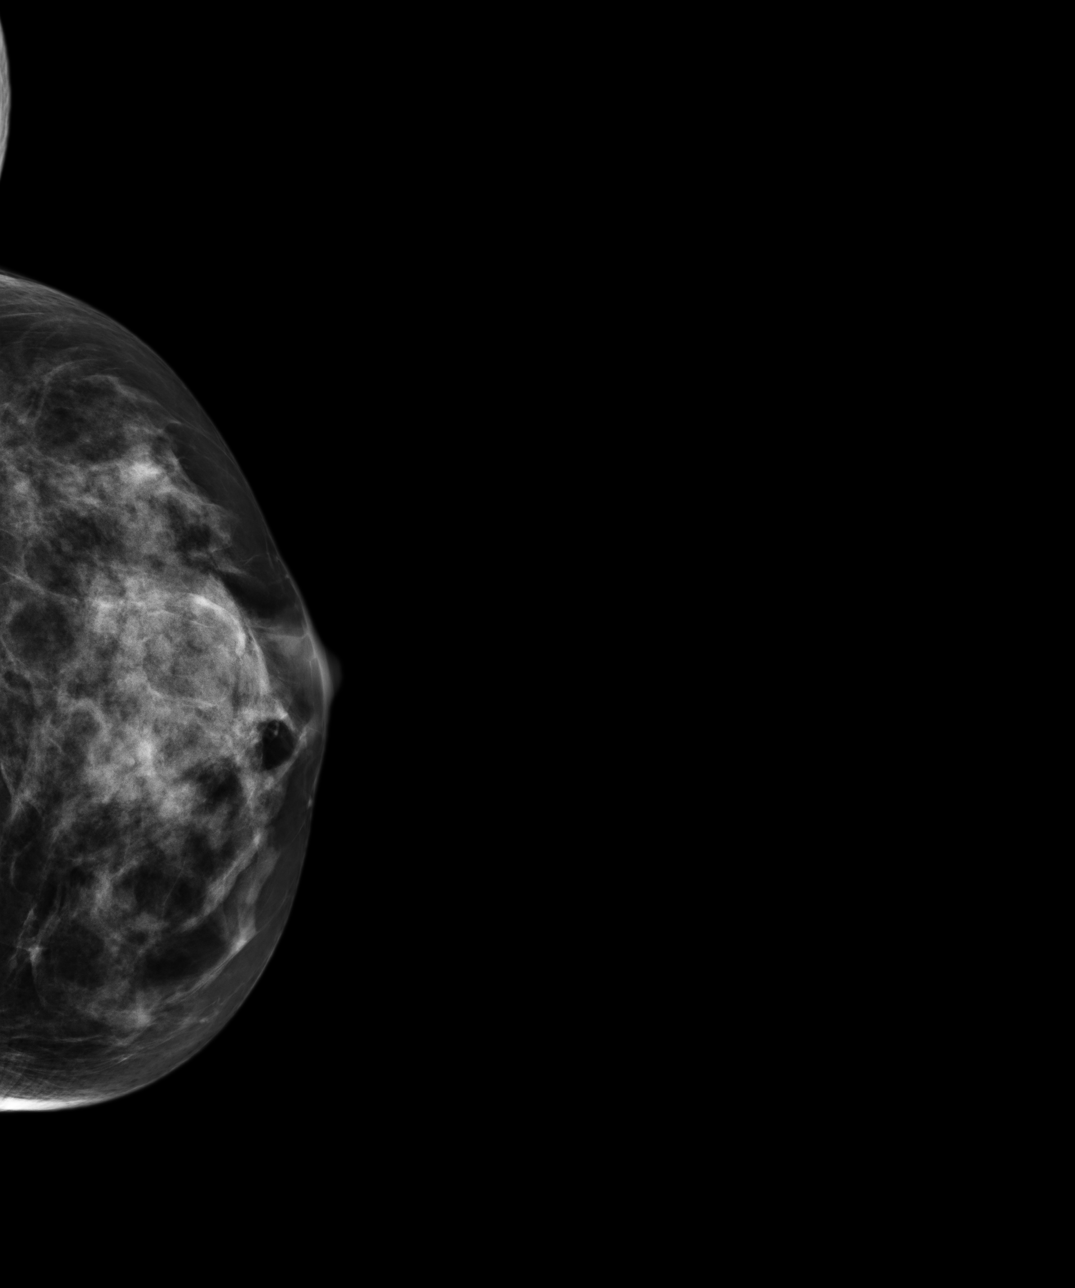
[im 3/4]
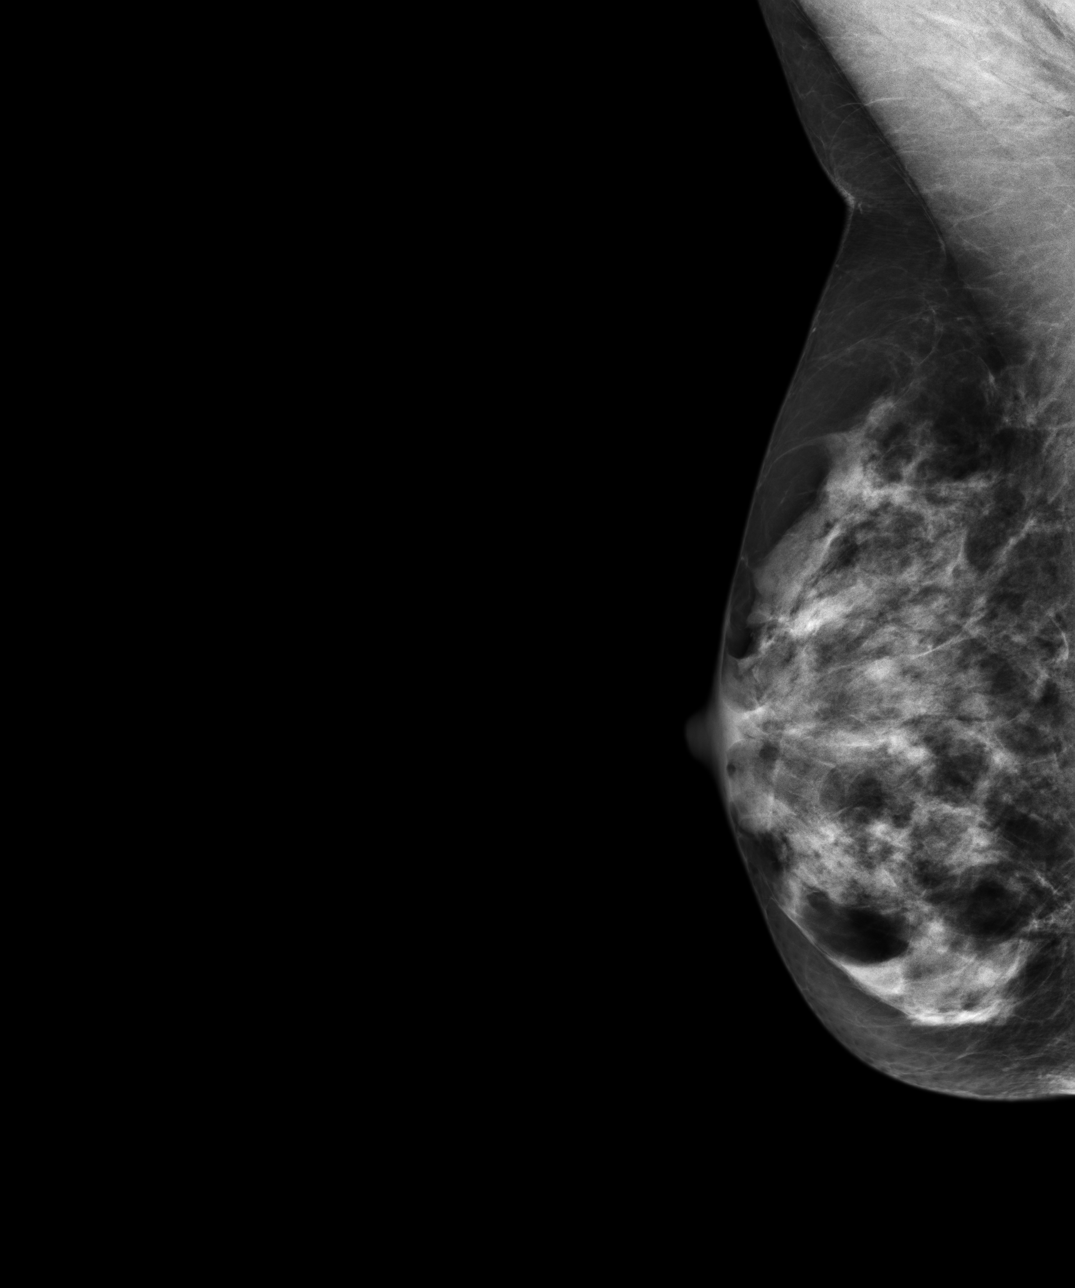
[im 4/4]
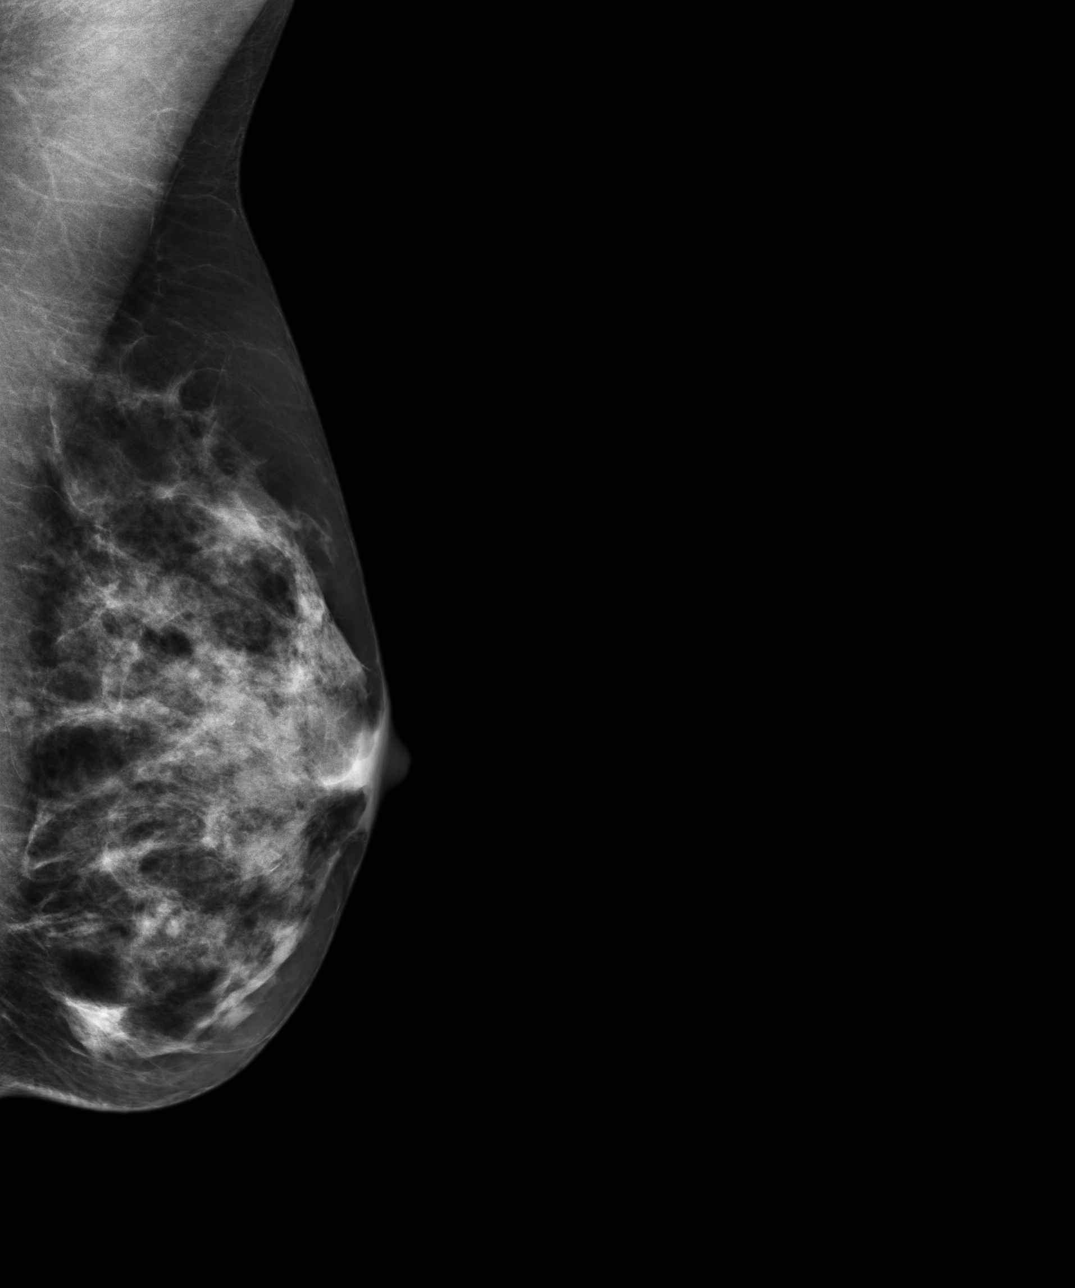

[4 of 4 positions shown; findings below may reference images not displayed]

ACR Breast Density Category d: The breast tissue is extremely dense,
which lowers the sensitivity of mammography.
FINDINGS: There are no findings suspicious for malignancy. Images were
processed with CAD.
IMPRESSION: No mammographic evidence of malignancy. A result letter of this
screening mammogram will be mailed directly to the patient.

RECOMMENDATION:
Screening mammogram in one year. (Code:BD-[id number])

BI-RADS CATEGORY  1: Negative.

## 2017-03-20 ENCOUNTER — Encounter: Payer: Self-pay | Admitting: Obstetrics and Gynecology

## 2017-03-20 HISTORY — PX: SHOULDER ARTHROSCOPY: SHX128

## 2017-03-27 ENCOUNTER — Encounter: Payer: Self-pay | Admitting: Internal Medicine

## 2017-04-09 ENCOUNTER — Encounter: Payer: Self-pay | Admitting: Obstetrics and Gynecology

## 2017-04-09 ENCOUNTER — Ambulatory Visit (INDEPENDENT_AMBULATORY_CARE_PROVIDER_SITE_OTHER): Payer: BC Managed Care – PPO | Admitting: Obstetrics and Gynecology

## 2017-04-09 VITALS — BP 127/73 | HR 78 | Ht 63.0 in | Wt 118.5 lb

## 2017-04-09 DIAGNOSIS — N900 Mild vulvar dysplasia: Secondary | ICD-10-CM

## 2017-04-09 DIAGNOSIS — N949 Unspecified condition associated with female genital organs and menstrual cycle: Secondary | ICD-10-CM | POA: Diagnosis not present

## 2017-04-09 DIAGNOSIS — N9089 Other specified noninflammatory disorders of vulva and perineum: Secondary | ICD-10-CM | POA: Diagnosis not present

## 2017-04-09 NOTE — Progress Notes (Signed)
GYN ENCOUNTER NOTE  Subjective:       Amy Riley is a 51 y.o. G0P0000 female is here for gynecologic evaluation of the following issues:   1. New lesion/ sore spot on bottom  Patient presents with signifigant history of VIN 1 biopsies as well as benign lesion biopsies from perianal region with new onset of a bump new near her bottom.  She states she noticed it about 2 weeks ago when she had a hemorrhoid flare.  She describes some mild discomfort and intermittent burning sensation associated with the new bump that is worse after a hot bath. Patient denies any discharge, itching urinary symptoms.  She is postmenopausal, experiences mild vasomotor symptoms that are not bothersome and is currently not on HRT.     Gynecologic History No LMP recorded (lmp unknown). Patient is postmenopausal. Contraception: post menopausal status    Obstetric History OB History  Gravida Para Term Preterm AB Living  0 0 0 0 0 0  SAB TAB Ectopic Multiple Live Births  0 0 0 0          Past Medical History:  Diagnosis Date  . Cancer ([place])    basal cell  . GERD (gastroesophageal reflux disease)   . Hypothyroidism     Past Surgical History:  Procedure Laterality Date  . CHOLECYSTECTOMY, LAPAROSCOPIC  12/16   [facility]  . NASAL SEPTOPLASTY W/ TURBINOPLASTY  06/1986   reduction  . SHOULDER ARTHROSCOPY Right 03/2017  . SKIN GRAFT  11/2002   Nasal (for epistaxis)    Current Outpatient Prescriptions on File Prior to Visit  Medication Sig Dispense Refill  . levothyroxine (SYNTHROID) 50 MCG tablet TAKE 1 TABLET BY MOUTH ONCE EVERY MORNING ON EMPTY STOMACH 90 tablet 3   No current facility-administered medications on file prior to visit.     Allergies  Allergen Reactions  . Acidophilus Other (See Comments)    Rectal spasms  . Aspirin     REACTION: nose bleeds  . Cefdinir Other (See Comments)  . Cephalosporins Other (See Comments)  . Nsaids Other (See Comments)    bleeding  . Tolmetin      Other reaction(s): Other (See Comments) bleeding    Social History   Social History  . Marital status: Married    Spouse name: N/A  . Number of children: 0  . Years of education: N/A   Occupational History  . 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 Smoking status: Never Smoker  . Smokeless tobacco: Never Used  . Alcohol use No  . Drug use: No  . Sexual activity: No   Other Topics Concern  . Not on file   Social History Narrative   Regular exercise Yes    Family History  Problem Relation Age of Onset  . Heart disease Father        atrial fib  . Cancer Father        colon cancer  . Hypertension Father   . Stroke Father   . Heart disease Maternal Uncle        CAD  . Cancer Paternal Aunt        colon cancer    The following portions of the patient's history were reviewed and updated as appropriate: allergies, current medications, past family history, past medical history, past social history, past surgical history and problem list.  Review of Systems Review of Systems  Constitutional: Positive for diaphoresis. Negative for  chills, fever and weight loss.       Mild hot flashes  HENT: Negative.   Eyes: Negative.   Respiratory: Negative.   Cardiovascular: Negative.   Gastrointestinal: Positive for constipation and diarrhea.  Genitourinary: Negative for dysuria, frequency and urgency.  Musculoskeletal: Negative.        Recent R shoulder surgery, currently in sling   Skin: Negative.   Neurological: Negative.   Endo/Heme/Allergies: Negative.   Psychiatric/Behavioral: Negative.      Objective:   BP 127/73   Pulse 78   Ht 5\' 3"  (1.6 m)   Wt 118 lb 8 oz (53.8 kg)   LMP  (LMP Unknown)   BMI 20.99 kg/m  CONSTITUTIONAL: Well-developed, well-nourished female in no acute distress.  HENT:  Normocephalic, atraumatic.  NECK: Not Examined SKIN: Skin is warm and dry. No rash noted. Not diaphoretic. No erythema. No  pallor. [place]: Alert and oriented to person, place, and time. PSYCHIATRIC: Normal mood and affect. Normal behavior. Normal judgment and thought content. CARDIOVASCULAR:Not Examined RESPIRATORY: Not Examined BREASTS: Not Examined ABDOMEN: Soft, non distended; Non tender.  No Organomegaly. PELVIC:  External Genitalia: Normal- 62mm white raised lesion on right sided posterior fourchette. Non tender. Appears more like with colposcopic visualization   MUSCULOSKELETAL: Normal range of motion. No tenderness.  No cyanosis, clubbing, or edema.  PROCEDURE: Vulvar biopsy Verbal consent is obtained. Patient was placed in the dorsal lithotomy position. The vulva is cleansed with Betadine solution. 1% lidocaine without epinephrine is injected locally with a total of 2 cc being utilized. 3 mm punch biopsy is taken and specimen was sent to pathology. Silver nitrate stick is used to facilitate hemostasis. Blood loss is minimal. She was well-tolerated. No complications were encountered.   Assessment:   1. Vulvar lesion-visulaized via colposcopy, biopsy taken, sent to pathology  2. Vaginal burning- mostly like due to lack of estrogen, will follow and revaluate in 1 week.      Plan:   1. Biopsy taken and sent to pathology- review with patient in 1 week  On return apt will perform pelvic exam to evaluate vaginal atrophy  A total of 15 minutes were spent face-to-face with the patient during this encounter and over half of that time dealt with counseling and coordination of care.  Edward Qualia, PA-[facility] Brayton Mars, MD   I have seen, interviewed, and examined the patient in conjunction with the [facility].A. student and affirm the diagnosis and management plan. Arshia Rondon A. Travontae Freiberger, MD, FACOG   Note: This dictation was prepared with Dragon dictation along with smaller phrase technology. Any transcriptional errors that result from this process are unintentional.

## 2017-04-09 NOTE — Patient Instructions (Signed)
1. Vulvar biopsy is completed today  2.Return in 1 week for follow-up  VULVAR BIOPSY POST-PROCEDURE INSTRUCTIONS  1. You may take Ibuprofen, Aleve or Tylenol for pain if needed.    2. You may have a small amount of spotting.  You should wear a mini pad for the next few days.  3. You may use some topical Neosporin ointment if you would like (over the counter is fine).  4. You need to call if you have redness around the biopsy site, if there is any unusual draining, if the bleeding is heavy, or if you are concerned.  5. Shower or bathe as normal  6. We will discuss the results at your follow-up appointment if needed.

## 2017-04-16 ENCOUNTER — Ambulatory Visit (INDEPENDENT_AMBULATORY_CARE_PROVIDER_SITE_OTHER): Payer: BC Managed Care – PPO | Admitting: Obstetrics and Gynecology

## 2017-04-16 ENCOUNTER — Encounter: Payer: Self-pay | Admitting: Obstetrics and Gynecology

## 2017-04-16 VITALS — BP 111/74 | HR 101 | Ht 63.0 in | Wt 118.2 lb

## 2017-04-16 DIAGNOSIS — N9089 Other specified noninflammatory disorders of vulva and perineum: Secondary | ICD-10-CM | POA: Diagnosis not present

## 2017-04-17 LAB — PATHOLOGY

## 2017-04-18 ENCOUNTER — Encounter: Payer: Self-pay | Admitting: Obstetrics and Gynecology

## 2017-04-18 NOTE — Patient Instructions (Signed)
1. Patient will be contacted regarding biopsy results when available

## 2017-04-18 NOTE — Progress Notes (Signed)
Chief complaint:  1. Vulvar lesion  2. Follow-up on biopsy  Patient presents for follow-up on vulvar biopsy results. 3 mm papule was removed last week; pathology is unavailable at this time. Patient reports no significant pelvic discomfort. Biopsy site is healing reasonably well. She does not desire exam today for evaluation.    OBJECTIVE:  BP 111/74   Pulse (!) 101   Ht 5\' 3"  (1.6 m)   Wt 118 lb 3.2 oz (53.6 kg)   LMP  (LMP Unknown)   BMI 20.94 kg/m  Physical exam deferred  ASSESSMENT:  1. Status post vulvar biopsy; pathology pending 2. Physical exam deferred per patient request  PLAN: 1. Biopsy results will be made available to the patient 2. Maintain follow-up for annual exam as scheduled  A total of 10 minutes were spent face-to-face with the patient during this encounter and over half of that time dealt with counseling and coordination of care.  Brayton Mars, MD  Note: This dictation was prepared with Dragon dictation along with smaller phrase technology. Any transcriptional errors that result from this process are unintentional.

## 2017-04-19 ENCOUNTER — Encounter: Payer: Self-pay | Admitting: Internal Medicine

## 2017-06-08 NOTE — Progress Notes (Signed)
Patient ID: Amy Riley, female   DOB: [birth year], 51 y.o.   MRN: [id number] ANNUAL PRE[facility]ATIVE CARE GYN  ENCOUNTER NOTE  Subjective:       Amy Riley is a 51 y.o. No obstetric history on file. female here for a routine annual gynecologic exam.  Current complaints: 1. 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  However, she does note some urinary urgency symptoms and frequency symptoms without dysuria. She continues to heal from rotator cuff surgery and labrum tear.  Surgery.  Gynecologic History No LMP recorded (lmp unknown). Patient is postmenopausal. Contraception: none Last Pap: 08/2013 neg/neg. Results were: normal Last mammogram: 01/2016 birad 1 . Results were: normal History of VIN 3  Obstetric History OB History  Gravida Para Term Preterm AB Living  0 0 0 0 0 0  SAB TAB Ectopic Multiple Live Births  0 0 0 0          Past Medical History:  Diagnosis Date  . Cancer ([place])    basal cell  . GERD (gastroesophageal reflux disease)   . Hypothyroidism     Past Surgical History:  Procedure Laterality Date  . CHOLECYSTECTOMY, LAPAROSCOPIC  12/16   [facility]  . NASAL SEPTOPLASTY W/ TURBINOPLASTY  06/1986   reduction  . SHOULDER ARTHROSCOPY Right 03/2017  . SKIN GRAFT  11/2002   Nasal (for epistaxis)    Current Outpatient Prescriptions on File Prior to Visit  Medication Sig Dispense Refill  . levothyroxine (SYNTHROID) 50 MCG tablet TAKE 1 TABLET BY MOUTH ONCE EVERY MORNING ON EMPTY STOMACH 90 tablet 3   No current facility-administered medications on file prior to visit.     Allergies  Allergen Reactions  . Acidophilus Other (See Comments)    Rectal spasms  . Aspirin     REACTION: nose bleeds  . Cefdinir Other (See Comments)  . Cephalosporins Other (See Comments)  . Nsaids Other (See Comments)    bleeding  . Tolmetin     Other  reaction(s): Other (See Comments) bleeding    Social History   Social History  . Marital status: Married    Spouse name: N/A  . Number of children: 0  . Years of education: N/A   Occupational History  . 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 Smoking status: Never Smoker  . Smokeless tobacco: Never Used  . Alcohol use No  . Drug use: No  . Sexual activity: No   Other Topics Concern  . Not on file   Social History Narrative   Regular exercise Yes    Family History  Problem Relation Age of Onset  . Heart disease Father        atrial fib  . Cancer Father        colon cancer  . Hypertension Father   . Stroke Father   . Heart disease Maternal Uncle        CAD  . Cancer Paternal Aunt        colon cancer    The following portions of the patient's history were reviewed and updated as appropriate: allergies, current medications, past family history, past medical history, past social history, past surgical history and problem list.  Review of Systems Review of Systems  Constitutional: Negative.   HENT: Negative.   Eyes: Negative.   Respiratory: Negative.  Cardiovascular: Negative.   Gastrointestinal: Negative.   Genitourinary: Positive for frequency.  Musculoskeletal:       Convalescence from right shoulder surgery,  Ongoing  Skin: Negative.   Neurological: Negative.   Endo/Heme/Allergies: Negative.   Psychiatric/Behavioral: Negative.      Objective:  BP 131/85   Pulse 87   Ht 5\' 3"  (1.6 m)   Wt 119 lb 8 oz (54.2 kg)   LMP  (LMP Unknown)   BMI 21.17 kg/m   menopause  CONSTITUTIONAL: Well-developed, well-nourished female in no acute distress.  PSYCHIATRIC: Normal mood and affect. Normal behavior. Normal judgment and thought content. [place]: Alert and oriented to person, place, and time. Normal muscle tone coordination. No cranial nerve deficit noted. HENT:  Normocephalic, atraumatic, External right and left  ear normal. Oropharynx is clear and moist EYES: Conjunctivae and EOM are normal. Pupils are equal, round, and reactive to light. No scleral icterus.  NECK: Normal range of motion, supple, no masses.  Normal thyroid.  SKIN: Skin is warm and dry. No rash noted. Not diaphoretic. No erythema. No pallor. CARDIOVASCULAR: Normal heart rate noted, regular rhythm, no murmur. RESPIRATORY: Clear to auscultation bilaterally. Effort and breath sounds normal, no problems with respiration noted. BREASTS: Symmetric in size. No masses, skin changes, nipple drainage, or lymphadenopathy. ABDOMEN: Soft, normal bowel sounds, no distention noted.  No tenderness, rebound or guarding.  BLADDER: Normal PELVIC:  External Genitalia: Normal without lesions  BUS: Normal  Vagina: Severe atrophy  Cervix: Normal; Nulliparous; no lesions  Uterus: Normal; midplane, normal size, shape, mobile, nontender  Adnexa: Normal; nonpalpable and nontender  RV: External Exam NormaI, No Rectal Masses and Normal Sphincter tone  MUSCULOSKELETAL: Normal range of motion. No tenderness.  No cyanosis, clubbing, or edema.  2+ distal pulses. LYMPHATIC: No Axillary, Supraclavicular, or Inguinal Adenopathy.    Assessment:   Annual gynecologic examination 51 y.o. Contraception: menopause bmi- 21 Vaginal atrophy  Plan:  Pap: pap/hpv Mammogram: Ordered Stool Guaiac Testing:  ordered Labs: thru pcp Routine preventative health maintenance measures emphasized: Exercise/Diet/Weight control, Tobacco Warnings and Alcohol/Substance use risks Return to [place], CMA  Brayton Mars, MD  Note: This dictation was prepared with Dragon dictation along with smaller phrase technology. Any transcriptional errors that result from this process are unintentional.

## 2017-06-09 ENCOUNTER — Ambulatory Visit (INDEPENDENT_AMBULATORY_CARE_PROVIDER_SITE_OTHER): Payer: BC Managed Care – PPO | Admitting: Obstetrics and Gynecology

## 2017-06-09 ENCOUNTER — Encounter: Payer: Self-pay | Admitting: Obstetrics and Gynecology

## 2017-06-09 VITALS — BP 131/85 | HR 87 | Ht 63.0 in | Wt 119.5 lb

## 2017-06-09 DIAGNOSIS — Z1231 Encounter for screening mammogram for malignant neoplasm of breast: Secondary | ICD-10-CM | POA: Diagnosis not present

## 2017-06-09 DIAGNOSIS — Z01419 Encounter for gynecological examination (general) (routine) without abnormal findings: Secondary | ICD-10-CM | POA: Diagnosis not present

## 2017-06-09 DIAGNOSIS — Z780 Asymptomatic menopausal state: Secondary | ICD-10-CM | POA: Diagnosis not present

## 2017-06-09 DIAGNOSIS — Z1211 Encounter for screening for malignant neoplasm of colon: Secondary | ICD-10-CM

## 2017-06-09 DIAGNOSIS — Z1239 Encounter for other screening for malignant neoplasm of breast: Secondary | ICD-10-CM

## 2017-06-09 DIAGNOSIS — D071 Carcinoma in situ of vulva: Secondary | ICD-10-CM

## 2017-06-09 DIAGNOSIS — N952 Postmenopausal atrophic vaginitis: Secondary | ICD-10-CM

## 2017-06-09 NOTE — Patient Instructions (Signed)
1.  Pap smear is done. 2.  Mammogram is ordered. 3.  Stool guaiac cards are given for colon cancer screeni  Screening labs are to be obtained for primary care. 5.  Patient is to return in 3 weeks for interval colposcopy evaluation as scheduled. 6.  Continue with healthy eating and exercise. 7.  Consider interval Rosa suppositories for management of vaginal atrophy. 8.  Return in one year for annual exam.   Health Maintenance for Postmenopausal Women Menopause is a normal process in which your reproductive ability comes to an end. This process happens gradually over a span of months to years, usually between the ages of 53 and 46. Menopause is complete when you have missed 12 consecutive menstrual periods. It is important to talk with your health care provider about some of the most common conditions that affect postmenopausal women, such as heart disease, cancer, and bone loss (osteoporosis). Adopting a healthy lifestyle and getting preventive care can help to promote your health and wellness. Those actions can also lower your chances of developing some of these common conditions. What should I know about menopause? During menopause, you may experience a number of symptoms, such as:  Moderate-to-severe hot flashes.  Night sweats.  Decrease in sex drive.  Mood swings.  Headaches.  Tiredness.  Irritability.  Memory problems.  Insomnia.  Choosing to treat or not to treat menopausal changes is an individual decision that you make with your health care provider. What should I know about hormone replacement therapy and supplements? Hormone therapy products are effective for treating symptoms that are associated with menopause, such as hot flashes and night sweats. Hormone replacement carries certain risks, especially as you become older. If you are thinking about using estrogen or estrogen with progestin treatments, discuss the benefits and risks with your health care provider. What  should I know about heart disease and stroke? Heart disease, heart attack, and stroke become more likely as you age. This may be due, in part, to the hormonal changes that your body experiences during menopause. These can affect how your body processes dietary fats, triglycerides, and cholesterol. Heart attack and stroke are both medical emergencies. There are many things that you can do to help prevent heart disease and stroke:  Have your blood pressure checked at least every 1-2 years. High blood pressure causes heart disease and increases the risk of stroke.  If you are 86-84 years old, ask your health care provider if you should take aspirin to prevent a heart attack or a stroke.  Do not use any tobacco products, including cigarettes, chewing tobacco, or electronic cigarettes. If you need help quitting, ask your health care provider.  It is important to eat a healthy diet and maintain a healthy weight. ? Be sure to include plenty of vegetables, fruits, low-fat dairy products, and lean protein. ? Avoid eating foods that are high in solid fats, added sugars, or salt (sodium).  Get regular exercise. This is one of the most important things that you can do for your health. ? Try to exercise for at least 150 minutes each week. The type of exercise that you do should increase your heart rate and make you sweat. This is known as moderate-intensity exercise. ? Try to do strengthening exercises at least twice each week. Do these in addition to the moderate-intensity exercise.  Know your numbers.Ask your health care provider to check your cholesterol and your blood glucose. Continue to have your blood tested as directed by your health  care provider.  What should I know about cancer screening? There are several types of cancer. Take the following steps to reduce your risk and to catch any cancer development as early as possible. Breast Cancer  Practice breast self-awareness. ? This means  understanding how your breasts normally appear and feel. ? It also means doing regular breast self-exams. Let your health care provider know about any changes, no matter how small.  If you are 44 or older, have a clinician do a breast exam (clinical breast exam or CBE) every year. Depending on your age, family history, and medical history, it may be recommended that you also have a yearly breast X-ray (mammogram).  If you have a family history of breast cancer, talk with your health care provider about genetic screening.  If you are at high risk for breast cancer, talk with your health care provider about having an MRI and a mammogram every year.  Breast cancer (BRCA) gene test is recommended for women who have family members with BRCA-related cancers. Results of the assessment will determine the need for genetic counseling and BRCA1 and for BRCA2 testing. BRCA-related cancers include these types: ? Breast. This occurs in males or females. ? Ovarian. ? Tubal. This may also be called fallopian tube cancer. ? Cancer of the abdominal or pelvic lining (peritoneal cancer). ? Prostate. ? Pancreatic.  Cervical, Uterine, and Ovarian Cancer Your health care provider may recommend that you be screened regularly for cancer of the pelvic organs. These include your ovaries, uterus, and vagina. This screening involves a pelvic exam, which includes checking for microscopic changes to the surface of your cervix (Pap test).  For women ages 21-65, health care providers may recommend a pelvic exam and a Pap test every three years. For women ages 89-65, they may recommend the Pap test and pelvic exam, combined with testing for human papilloma virus (HPV), every five years. Some types of HPV increase your risk of cervical cancer. Testing for HPV may also be done on women of any age who have unclear Pap test results.  Other health care providers may not recommend any screening for nonpregnant women who are  considered low risk for pelvic cancer and have no symptoms. Ask your health care provider if a screening pelvic exam is right for you.  If you have had past treatment for cervical cancer or a condition that could lead to cancer, you need Pap tests and screening for cancer for at least 20 years after your treatment. If Pap tests have been discontinued for you, your risk factors (such as having a new sexual partner) need to be reassessed to determine if you should start having screenings again. Some women have medical problems that increase the chance of getting cervical cancer. In these cases, your health care provider may recommend that you have screening and Pap tests more often.  If you have a family history of uterine cancer or ovarian cancer, talk with your health care provider about genetic screening.  If you have vaginal bleeding after reaching menopause, tell your health care provider.  There are currently no reliable tests available to screen for ovarian cancer.  Lung Cancer Lung cancer screening is recommended for adults 89-93 years old who are at high risk for lung cancer because of a history of smoking. A yearly low-dose CT scan of the lungs is recommended if you:  Currently smoke.  Have a history of at least 30 pack-years of smoking and you currently smoke or have quit  within the past 15 years. A pack-year is smoking an average of one pack of cigarettes per day for one year.  Yearly screening should:  Continue until it has been 15 years since you quit.  Stop if you develop a health problem that would prevent you from having lung cancer treatment.  Colorectal Cancer  This type of cancer can be detected and can often be prevented.  Routine colorectal cancer screening usually begins at age 68 and continues through age 12.  If you have risk factors for colon cancer, your health care provider may recommend that you be screened at an earlier age.  If you have a family history of  colorectal cancer, talk with your health care provider about genetic screening.  Your health care provider may also recommend using home test kits to check for hidden blood in your stool.  A small camera at the end of a tube can be used to examine your colon directly (sigmoidoscopy or colonoscopy). This is done to check for the earliest forms of colorectal cancer.  Direct examination of the colon should be repeated every 5-10 years until age 60. However, if early forms of precancerous polyps or small growths are found or if you have a family history or genetic risk for colorectal cancer, you may need to be screened more often.  Skin Cancer  Check your skin from head to toe regularly.  Monitor any moles. Be sure to tell your health care provider: ? About any new moles or changes in moles, especially if there is a change in a mole's shape or color. ? If you have a mole that is larger than the size of a pencil eraser.  If any of your family members has a history of skin cancer, especially at a young age, talk with your health care provider about genetic screening.  Always use sunscreen. Apply sunscreen liberally and repeatedly throughout the day.  Whenever you are outside, protect yourself by wearing long sleeves, pants, a wide-brimmed hat, and sunglasses.  What should I know about osteoporosis? Osteoporosis is a condition in which bone destruction happens more quickly than new bone creation. After menopause, you may be at an increased risk for osteoporosis. To help prevent osteoporosis or the bone fractures that can happen because of osteoporosis, the following is recommended:  If you are 35-94 years old, get at least 1,000 mg of calcium and at least 600 mg of vitamin D per day.  If you are older than age 5 but younger than age 42, get at least 1,200 mg of calcium and at least 600 mg of vitamin D per day.  If you are older than age 9, get at least 1,200 mg of calcium and at least 800 mg  of vitamin D per day.  Smoking and excessive alcohol intake increase the risk of osteoporosis. Eat foods that are rich in calcium and vitamin D, and do weight-bearing exercises several times each week as directed by your health care provider. What should I know about how menopause affects my mental health? Depression may occur at any age, but it is more common as you become older. Common symptoms of depression include:  Low or sad mood.  Changes in sleep patterns.  Changes in appetite or eating patterns.  Feeling an overall lack of motivation or enjoyment of activities that you previously enjoyed.  Frequent crying spells.  Talk with your health care provider if you think that you are experiencing depression. What should I know about immunizations?  It is important that you get and maintain your immunizations. These include:  Tetanus, diphtheria, and pertussis (Tdap) booster vaccine.  Influenza every year before the flu season begins.  Pneumonia vaccine.  Shingles vaccine.  Your health care provider may also recommend other immunizations. This information is not intended to replace advice given to you by your health care provider. Make sure you discuss any questions you have with your health care provider. Document Released: 11/28/2005 Document Revised: 04/25/2016 Document Reviewed: 07/10/2015 Elsevier Interactive Patient Education  2018 [employer].

## 2017-06-11 ENCOUNTER — Encounter: Payer: BC Managed Care – PPO | Admitting: Obstetrics and Gynecology

## 2017-06-15 LAB — PAPIG, HPV, RFX 16/18
HPV, high-risk: NEGATIVE
PAP Smear Comment: 0

## 2017-07-06 ENCOUNTER — Ambulatory Visit (INDEPENDENT_AMBULATORY_CARE_PROVIDER_SITE_OTHER): Payer: BC Managed Care – PPO | Admitting: Obstetrics and Gynecology

## 2017-07-06 ENCOUNTER — Ambulatory Visit
Admission: RE | Admit: 2017-07-06 | Discharge: 2017-07-06 | Disposition: A | Discharge status: Home / Self Care | Payer: BC Managed Care – PPO | Source: Ambulatory Visit | Attending: Obstetrics and Gynecology | Admitting: Obstetrics and Gynecology

## 2017-07-06 ENCOUNTER — Encounter: Payer: Self-pay | Admitting: Obstetrics and Gynecology

## 2017-07-06 VITALS — BP 122/80 | HR 96 | Ht 63.0 in | Wt 120.9 lb

## 2017-07-06 DIAGNOSIS — Z1231 Encounter for screening mammogram for malignant neoplasm of breast: Secondary | ICD-10-CM | POA: Diagnosis not present

## 2017-07-06 DIAGNOSIS — Z1239 Encounter for other screening for malignant neoplasm of breast: Secondary | ICD-10-CM

## 2017-07-06 DIAGNOSIS — D071 Carcinoma in situ of vulva: Secondary | ICD-10-CM

## 2017-07-06 MED ORDER — VALACYCLOVIR HCL 1 G PO TABS: 1000.0000 mg | ORAL_TABLET | Freq: Three times a day (TID) | ORAL | 0 refills | Status: DC

## 2017-07-07 NOTE — Patient Instructions (Signed)
1. Return in 6 months for vulvar colposcopy

## 2017-07-07 NOTE — Progress Notes (Signed)
Chief complaint: 1. Colposcopy 2. History of VIN 3  SUBJECTIVE: Patient denies vulvar burning, itching or pain.  Vulvar pathology history: 07/06/2017 colposcopy-normal vulvar colposcopy; no biopsies 12/04/2015 posterior fourchette, right side, 5 mm grayish white lesion, 5 mm Gray mole             Posterior fourchette , inferior-VIN-I                  Posterior fourchette, superior-VIN-I 03/06/2016 colposcopy, vulvar-normal (no biopsies) 09/04/2016 colposcopy, vulvar-idffuse white epithelium/papillation affect right labia majora, partially on left labia majora, bilateral perianal region, and faint acetowhite epithelium on labia minora bilaterally             Perianal vulvar, right-benign skin tissue with hyperkeratosis parakeratosis and chronic inflammation             Labia majora, rightVIN-I/flat condyloma/HPV effect 12/10/2016 colposcopy, vulvar-faint acetowhite epithelium at the posterior fourchette only (see geographic mapping diagram  OBJECTIVE: BP 122/80   Pulse 96   Ht 5\' 3"  (1.6 m)   Wt 120 lb 14.4 oz (54.8 kg)   LMP  (LMP Unknown)   BMI 21.42 kg/m  Pleasant female in no acute distress. Alert and oriented. Pelvic exam: External genitalia-normal BUS-normal  PROCEDURE: Vulvar colposcopy Verbal consent is obtained. Patient was placed in the dorsal lithotomy position acetic acid soaked 4 x 4 pads are placed onto the vulva for several minutes. Following removal colposcopy is performed; labia majora, labia minora, and posterior fourchette as well as perianal region are normal without evidence of lesions. No biopsies are taken.  ASSESSMENT: 1. History of VIN 3 2. Asymptomatic 3. Colposcopy normal today  PLAN: 1. Return in 6 months for repeat vulvar colposcopy 2. Return sooner if symptoms of vulvar itching burning or pain developed  Brayton Mars, MD  Note: This dictation was prepared with Dragon dictation along with smaller phrase technology. Any transcriptional  errors that result from this process are unintentional.

## 2017-11-02 ENCOUNTER — Encounter: Payer: Self-pay | Admitting: Internal Medicine

## 2017-11-02 ENCOUNTER — Telehealth: Payer: BC Managed Care – PPO | Admitting: Family

## 2017-11-02 DIAGNOSIS — J329 Chronic sinusitis, unspecified: Secondary | ICD-10-CM

## 2017-11-02 DIAGNOSIS — B9689 Other specified bacterial agents as the cause of diseases classified elsewhere: Secondary | ICD-10-CM

## 2017-11-02 MED ORDER — AMOXICILLIN 500 MG PO TABS: 1000.0000 mg | ORAL_TABLET | Freq: Two times a day (BID) | ORAL | 0 refills | Status: AC

## 2017-11-02 MED ORDER — BENZONATATE 100 MG PO CAPS: 100.0000 mg | ORAL_CAPSULE | Freq: Three times a day (TID) | ORAL | 0 refills | Status: DC | PRN

## 2017-11-02 MED ORDER — LEVOFLOXACIN 500 MG PO TABS: 500.0000 mg | ORAL_TABLET | Freq: Every day | ORAL | 0 refills | Status: DC

## 2017-11-02 NOTE — Progress Notes (Signed)
Thank you for the details you included in the comment boxes. Those details are very helpful in determining the best course of treatment for you and help [place] to provide the best care.  We are sorry that you are not feeling well.  Here is how we plan to help!  Based on what you have shared with me it looks like you have sinusitis.  Sinusitis is inflammation and infection in the sinus cavities of the head.  Based on your presentation I believe you most likely have Acute Bacterial Sinusitis.  This is an infection caused by bacteria and is treated with antibiotics. I have prescribed Levofloxicin 500mg  by mouth once daily for 7 days. You may use an oral decongestant such as Mucinex D or if you have glaucoma or high blood pressure use plain Mucinex. Saline nasal spray help and can safely be used as often as needed for congestion.  If you develop worsening sinus pain, fever or notice severe headache and vision changes, or if symptoms are not better after completion of antibiotic, please schedule an appointment with a health care provider.    I have also sent tessalon perles, 100mg , take 1-2 every 8 hours as needed for cough.   Sinus infections are not as easily transmitted as other respiratory infection, however we still recommend that you avoid close contact with loved ones, especially the very young and elderly.  Remember to wash your hands thoroughly throughout the day as this is the number one way to prevent the spread of infection!  Home Care:  Only take medications as instructed by your medical team.  Complete the entire course of an antibiotic.  Do not take these medications with alcohol.  A steam or ultrasonic humidifier can help congestion.  You can place a towel over your head and breathe in the steam from hot water coming from a faucet.  Avoid close contacts especially the very young and the elderly.  Cover your mouth when you cough or sneeze.  Always remember to wash your hands.  Get  Help Right Away If:  You develop worsening fever or sinus pain.  You develop a severe head ache or visual changes.  Your symptoms persist after you have completed your treatment plan.  Make sure you  Understand these instructions.  Will watch your condition.  Will get help right away if you are not doing well or get worse.  Your e-visit answers were reviewed by a board certified advanced clinical practitioner to complete your personal care plan.  Depending on the condition, your plan could have included both over the counter or prescription medications.  If there is a problem please reply  once you have received a response from your provider.  Your safety is important to [place].  If you have drug allergies check your prescription carefully.    You can use MyChart to ask questions about today's visit, request a non-urgent call back, or ask for a work or school excuse for 24 hours related to this e-Visit. If it has been greater than 24 hours you will need to follow up with your provider, or enter a new e-Visit to address those concerns.  You will get an e-mail in the next two days asking about your experience.  I hope that your e-visit has been valuable and will speed your recovery. Thank you for using e-visits.

## 2017-12-21 ENCOUNTER — Ambulatory Visit: Payer: Self-pay

## 2017-12-21 NOTE — Telephone Encounter (Signed)
Patient called in with c/o "headache." She says "I am under stress at work and this has happened before. 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 The headache usually goes away, but today it's not going away. I have a history of stroke in my family, so I thought I would call to get this checked out, because it has happened more than once or twice." I asked is the headache constant, she says "today it is." I asked is she having any other symptoms, she said "stress in my shoulders and neck; eye strain from looking at the computer all day, otherwise no other symptoms." She denies numbness or weakness of extremities, blurred vision or vision changes. According to protocol, see PCP within 24 hours, no availability with provider, appointment made for tomorrow at 1530 with Clarene Reamer, NP, care advice given, she verbalized understanding.  Reason for Disposition . [1] New headache AND [2] age > 93  Answer Assessment - Initial Assessment Questions 1. LOCATION: "Where does it hurt?"      Top left side 2. ONSET: "When did the headache start?" (Minutes, hours or days)      Before 9-10 am at work 3. PATTERN: "Does the pain come and go, or has it been constant since it started?"     Constant  4. SEVERITY: "How bad is the pain?" and "What does it keep you from doing?"  (e.g., Scale 1-10; mild, moderate, or severe)   - MILD (1-3): doesn't interfere with normal activities    - MODERATE (4-7): interferes with normal activities or awakens from sleep    - SEVERE (8-10): excruciating pain, unable to do any normal activities       Mild 5. RECURRENT SYMPTOM: "Have you ever had headaches before?" If so, ask: "When was the last time?" and "What happened that time?"      Yes-last year or year before 6. CAUSE: "What do you think is causing the headache?"     No 7. MIGRAINE: "Have you been diagnosed with migraine headaches?" If so, ask: "Is  this headache similar?"      No 8. HEAD INJURY: "Has there been any recent injury to the head?"      NO 9. OTHER SYMPTOMS: "Do you have any other symptoms?" (fever, stiff neck, eye pain, sore throat, cold symptoms)     Stress in shoulders and neck; eye strain; no sore throat or cold symptoms 10. PREGNANCY: "Is there any chance you are pregnant?" "When was your last menstrual period?"       No  Protocols used: HEADACHE-A-AH

## 2017-12-23 ENCOUNTER — Encounter: Payer: Self-pay | Admitting: Family Medicine

## 2017-12-23 ENCOUNTER — Ambulatory Visit: Payer: BC Managed Care – PPO | Admitting: Family Medicine

## 2017-12-23 VITALS — BP 122/64 | HR 77 | Temp 97.7°F | Wt 122.0 lb

## 2017-12-23 DIAGNOSIS — G4489 Other headache syndrome: Secondary | ICD-10-CM

## 2017-12-23 NOTE — Patient Instructions (Signed)
Keep a log of instances- frequency, severity, duration, precipitating factors  If any severe pain, new symptoms please schedule an appointment

## 2017-12-23 NOTE — Progress Notes (Signed)
Subjective:    Patient ID: Amy Riley, female    DOB: 1966-05-09, 52 y.o.   MRN: [id number]  HPI This is a 52 yo female who presents today with headache. Yesterday, while she was at work, which is stressful, she had an episode where she lost her train of thought and then had some ache in her head. This resolved spontaneously within an hour. This has happened several times over the last couple of years. 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 She just wanted it checked out and was triaged by a nurse who insisted she be seen within 24 hours. She is not very concerned that this is an urgent problem but wanted to be seen due to her father having a stroke in his 82s.  She does not have any hyperlipidemia, HTN, smoking history. Is post menopausal x 2-3 years.  Has had chronic sinus surgeries. Currently with sinus congestion.  Currently feeling some sinus pressure on front left side of head. Pain is at a 1/10. More of slight discomfort. Has tension in shoulders. Is under a great deal of stress taking care of her husband who is in poor health, managing her mother's affairs and getting ready to move from her home into a townhouse. She has not had time to exercise as much lately which really helps with her stress.    Past Medical History:  Diagnosis Date  . Cancer ([place])    basal cell  . GERD (gastroesophageal reflux disease)   . Hypothyroidism    Past Surgical History:  Procedure Laterality Date  . CHOLECYSTECTOMY, LAPAROSCOPIC  12/16   [facility]  . NASAL SEPTOPLASTY W/ TURBINOPLASTY  06/1986   reduction  . SHOULDER ARTHROSCOPY Right 03/2017  . SKIN GRAFT  11/2002   Nasal (for epistaxis)   Family History  Problem Relation Age of Onset  . Heart disease Father        atrial fib  . Cancer Father        colon cancer  . Hypertension Father     . Stroke Father   . Heart disease Maternal Uncle        CAD  . Cancer Paternal Aunt        colon cancer  . Asthma Neg Hx   . Diabetes Neg Hx    Social History   Tobacco Use  . Smoking status: Never Smoker  . Smokeless tobacco: Never Used  Substance Use Topics  . Alcohol use: No    Alcohol/week: 0.0 oz  . Drug use: No      Review of Systems Per HPI    Objective:   Physical Exam  Constitutional: She is oriented to person, place, and time. She appears well-developed and well-nourished.  HENT:  Head: Normocephalic and atraumatic.  Nose: Right sinus exhibits no maxillary sinus tenderness and no frontal sinus tenderness. Left sinus exhibits no maxillary sinus tenderness and no frontal sinus tenderness.  Mouth/Throat: Oropharynx is clear and moist. No oropharyngeal exudate.  Very small nasal passages.   Eyes: Conjunctivae and EOM are normal. Pupils are equal, round, and reactive to light. Right eye exhibits no discharge. Left eye exhibits no discharge.  Neck: Normal range of motion. Neck supple.  Cardiovascular: Normal rate, regular rhythm and normal heart sounds.  Pulmonary/Chest: Breath sounds normal.  Musculoskeletal: Normal range of motion. She exhibits no edema.  Lymphadenopathy:    She has no cervical adenopathy.  Neurological: She is alert and oriented to person, place, and time. She displays no tremor. No cranial nerve deficit or sensory deficit. She exhibits normal muscle tone. Coordination and gait normal. GCS eye subscore is 4. GCS verbal subscore is 5. GCS motor subscore is 6.  Reflex Scores:      Bicep reflexes are 2+ on the right side and 2+ on the left side.      Brachioradialis reflexes are 2+ on the right side and 2+ on the left side.      Patellar reflexes are 2+ on the right side and 2+ on the left side. No facial asymmetry. No pronator drift.   Skin: Skin is warm and dry.  Psychiatric: She has a normal mood and affect. Her behavior is normal. Judgment and  thought content normal.  Vitals reviewed.     BP 122/64   Pulse 77   Temp 97.7 F (36.5 C) (Oral)   Wt 122 lb (55.3 kg)   LMP  (LMP Unknown)   SpO2 99%   BMI 21.61 kg/m  Wt Readings from Last 3 Encounters:  12/23/17 122 lb (55.3 kg)  07/06/17 120 lb 14.4 oz (54.8 kg)  06/09/17 119 lb 8 oz (54.2 kg)       Assessment & Plan:  1. Other headache syndrome - no abnormal findings on physical exam. Episodes are very sporadic and occur within a very specific, stressful situation. I suspect this is related to increased stress and trying to multitask. - reviewed risk factors for stroke as patient concerned with family history  - she will keep a log of occurrences - if any worsening, increased frequency or intensity, new symptoms, she will follow up or go to ER - discussed stress relief, self care, encouraged regular exercise  Clarene Reamer, FNP-BC  [facility] [place], [place]  12/24/2017 4:01 PM

## 2017-12-24 ENCOUNTER — Encounter: Payer: Self-pay | Admitting: Family Medicine

## 2018-01-05 ENCOUNTER — Ambulatory Visit (INDEPENDENT_AMBULATORY_CARE_PROVIDER_SITE_OTHER): Payer: BC Managed Care – PPO | Admitting: Obstetrics and Gynecology

## 2018-01-05 ENCOUNTER — Encounter: Payer: BC Managed Care – PPO | Admitting: Obstetrics and Gynecology

## 2018-01-05 ENCOUNTER — Encounter: Payer: Self-pay | Admitting: Obstetrics and Gynecology

## 2018-01-05 VITALS — BP 115/76 | HR 98 | Ht 63.0 in | Wt 119.4 lb

## 2018-01-05 DIAGNOSIS — N900 Mild vulvar dysplasia: Secondary | ICD-10-CM

## 2018-01-05 DIAGNOSIS — N762 Acute vulvitis: Secondary | ICD-10-CM | POA: Diagnosis not present

## 2018-01-05 NOTE — Patient Instructions (Signed)
1.  Return in 4 weeks for vulvar colposcopy 2.  Recommend sitz bath's once or twice a day, with blow drying of perineum with a hair dryer-warm heat 3.  Continue taking antibiotics as prescribed by GI for the next 2 weeks

## 2018-01-05 NOTE — Progress Notes (Signed)
Chief complaint: 1.  History of VIN 2.  Colposcopy 3.  Vulvar inflammation  Neave presents today for interval colposcopy.  Patient has been having some small bowel issues being managed by GI medicine; she currently is on antibiotics for this condition.  She states that over the past several days she developed irritation in the right perianal region as well as adjacent to the introitus.  She has no history of HSV.  No history of skin lesions.  No vaginal discharge.  No vaginal bleeding.  Past medical history: Past surgical history, problem list, medications, and allergies are reviewed  OBJECTIVE: BP 115/76   Pulse 98   Ht 5\' 3"  (1.6 m)   Wt 119 lb 6.4 oz (54.2 kg)   LMP  (LMP Unknown)   BMI 21.15 kg/m  Pleasant female no acute distress.  Alert and oriented. Pelvic exam: External genitalia-labia minora and labia majora grossly normal; posterior fourchette is notable for right-sided hyperemia and localized cystic mass 1 cm in diameter adjacent to the introitus; inferior to this area of hyperemia are 2 smaller lesions located in the perianal region at 9:00; each of these other lesions measure 3-5 mm, non-weeping, slightly tender without ulceration BUS-normal Vagina-mild atrophic changes noted at the introitus  ASSESSMENT: 1.  Acute vulvitis, unclear etiology, possibly related to folliculitis, possible insect bite,? 2.  Condition not amenable for colposcopy today  PLAN: 1.  Continue antibiotic as prescribed by GI for 2 weeks 2.  Sitz bath's twice a day; blow dry perineum with hair dryer using warm heat 3.  Return in 4 weeks for vulvar colposcopy  Brayton Mars, MD  Note: This dictation was prepared with Dragon dictation along with smaller phrase technology. Any transcriptional errors that result from this process are unintentional.

## 2018-01-14 ENCOUNTER — Encounter: Payer: Self-pay | Admitting: Internal Medicine

## 2018-01-15 NOTE — Telephone Encounter (Signed)
Please offer her appt next week

## 2018-01-18 NOTE — Telephone Encounter (Signed)
I called patient to schedule appointment.  Patient said she has decided to see the nutritionist and try to get her GI doctor to do lab work.  Patient said she'll keep Dr.Letvak posted through my chart.

## 2018-01-26 ENCOUNTER — Encounter: Payer: BC Managed Care – PPO | Admitting: Obstetrics and Gynecology

## 2018-01-29 ENCOUNTER — Encounter: Payer: Self-pay | Admitting: Internal Medicine

## 2018-02-01 ENCOUNTER — Encounter: Payer: Self-pay | Admitting: Internal Medicine

## 2018-02-02 ENCOUNTER — Other Ambulatory Visit: Payer: Self-pay | Admitting: Internal Medicine

## 2018-02-09 ENCOUNTER — Ambulatory Visit (INDEPENDENT_AMBULATORY_CARE_PROVIDER_SITE_OTHER): Payer: BC Managed Care – PPO | Admitting: Obstetrics and Gynecology

## 2018-02-09 ENCOUNTER — Encounter: Payer: Self-pay | Admitting: Obstetrics and Gynecology

## 2018-02-09 VITALS — BP 108/68 | HR 90 | Ht 63.0 in | Wt 114.7 lb

## 2018-02-09 DIAGNOSIS — N900 Mild vulvar dysplasia: Secondary | ICD-10-CM | POA: Diagnosis not present

## 2018-02-09 NOTE — Progress Notes (Signed)
Chief complaint: 1.  Colposcopy 2.  History of in 3  OBJECTIVE: Patient reports no significant vulvar burning, itching, or pain  Vulvar pathology history: 07/06/2017 colposcopy-normal vulvar colposcopy; no biopsies 12/04/2015 posterior fourchette, right side, 5 mm grayish white lesion, 5 mm Gray mole Posterior fourchette , inferior-VIN-I Posterior fourchette, superior-VIN-I 03/06/2016 colposcopy, vulvar-normal (no biopsies) 09/04/2016 colposcopy, vulvar-idffuse white epithelium/papillation affect right labia majora, partially on left labia majora, bilateral perianal region, and faint acetowhite epithelium on labia minora bilaterally Perianal vulvar, right-benign skin tissue with hyperkeratosis parakeratosis and chronic inflammation Labia majora, rightVIN-I/flat condyloma/HPV effect 12/10/2016 colposcopy, vulvar-faint acetowhite epithelium at the posterior fourchette only (see geographic mapping diagram 07/06/2017 colposcopy, normal; no biopsies 02/09/2018 colposcopy,labia majora notable for acetowhite epithelium in several punctate areas; right labia minora, and posterior fourchette has normal appearance; perianal region notable for multiple acetowhite punctate areas on right side.  Right labia majora biopsy-  Right perianal biopsy-  OBJECTIVE: BP 108/68   Pulse 90   Ht 5\' 3"  (1.6 m)   Wt 114 lb 11.2 oz (52 kg)   LMP  (LMP Unknown)   BMI 20.32 kg/m  Pleasant female in no acute distress. Alert and oriented. Pelvic exam: External genitalia-normal BUS-normal  PROCEDURE: Vulvar colposcopy with biopsies Verbal consent is obtained. Patient was placed in the dorsal lithotomy position acetic acid soaked 4 x 4 pads are placed onto the vulva for several minutes. Following removal, colposcopy is performed; right labia majora notable for acetowhite epithelium in several punctate areas; labia minora, and posterior fourchette has normal  appearance; perianal region notable for multiple acetowhite punctate areas on right side.  Right labia majora and right perianal 3.0 mm biopsies are taken.  Silver nitrate is used for hemostasis.  Procedure is well-tolerated.  ASSESSMENT: 1.  History of VIN 3 2.  Asymptomatic 3.  Colposcopy notable for punctate areas of acetowhite epithelium on right labia majora and right perianal region, biopsied  PLAN: 1.  Return in 6 months for repeat vulvar colposcopy 2.  Return sooner if symptoms vulvar itching burning or pain develop 3.  Results will remain available through my chart. 4.  Post biopsy instructions are given  Brayton Mars, MD  Note: This dictation was prepared with Dragon dictation along with smaller phrase technology. Any transcriptional errors that result from this process are unintentional.

## 2018-02-09 NOTE — Patient Instructions (Addendum)
1.  Vulvar colposcopy and biopsy is performed today 2.  Results will be made available following pathology assessment 3.  Return in 6 months for repeat vulvar colposcopy  VULVAR BIOPSY POST-PROCEDURE INSTRUCTIONS  1. You may take Ibuprofen, Aleve or Tylenol for pain if needed.    2. You may have a small amount of spotting.  You should wear a mini pad for the next few days.  3. You may use some topical Neosporin ointment if you would like (over the counter is fine).  4. You need to call if you have redness around the biopsy site, if there is any unusual draining, if the bleeding is heavy, or if you are concerned.  5. Shower or bathe as normal  6.  Results will be made available through my chart

## 2018-02-09 NOTE — Addendum Note (Signed)
Addended by: Elouise Munroe on: 02/09/2018 04:37 PM   Modules accepted: Orders

## 2018-02-12 LAB — PATHOLOGY

## 2018-02-15 ENCOUNTER — Encounter: Payer: Self-pay | Admitting: Obstetrics and Gynecology

## 2018-03-08 ENCOUNTER — Encounter: Payer: Self-pay | Admitting: Internal Medicine

## 2018-04-07 ENCOUNTER — Encounter: Payer: Self-pay | Admitting: Internal Medicine

## 2018-04-07 ENCOUNTER — Encounter: Payer: BC Managed Care – PPO | Admitting: Internal Medicine

## 2018-04-07 MED ORDER — LEVOTHYROXINE SODIUM 50 MCG PO TABS: ORAL_TABLET | ORAL | 1 refills | Status: DC

## 2018-04-07 NOTE — Telephone Encounter (Signed)
Please send her thyroid refill for a year

## 2018-05-24 ENCOUNTER — Encounter: Payer: Self-pay | Admitting: Internal Medicine

## 2018-05-24 MED ORDER — TRIAMCINOLONE ACETONIDE 0.1 % EX CREA: 1.0000 "application " | TOPICAL_CREAM | Freq: Two times a day (BID) | CUTANEOUS | 1 refills | Status: DC | PRN

## 2018-06-14 NOTE — Progress Notes (Signed)
Patient ID: Amy Riley, female   DOB: 1965-12-18, 52 y.o.   MRN: [id number] [facility]NUAL [facility] GYN  ENCOUNTER NOTE  Subjective:       Amy Riley is a 52 y.o. No obstetric history on file. female here for a routine annual gynecologic exam.  Current complaints:  1. Slight vaginal burning- only occasionally  Last vulvar colposcopy included biopsy of the perianal region that showed VIN 1 and posterior fourchette biopsy which was benign.  Bowel issues are improving and she is working on gaining weight. Bladder function is normal Interval history changes include fracture of metatarsal bone in foot. Menopausal, not on HRT; family history of osteoporosis; significant GI disease including reflux precluding use of alendronate's  Patient is not sexually active with intercourse; vaginal atrophy symptoms are not problematic   Gynecologic History No LMP recorded (lmp unknown). Patient is postmenopausal. Contraception: none Last Pap: 06/10/2017 neg/neg. Results were: normal Last mammogram: 06/2017 birad 1 . Results were: normal History of VIN 3 Vulvar pathology history: 07/06/2017 colposcopy-normal vulvar colposcopy; no biopsies 12/04/2015 posterior fourchette, right side, 5 mm grayish white lesion, 5 mm Gray mole Posterior fourchette , inferior-VIN-I Posterior fourchette, superior-VIN-I 03/06/2016 colposcopy, vulvar-normal (no biopsies) 09/04/2016 colposcopy, vulvar-idffuse white epithelium/papillation affect right labia majora, partially on left labia majora, bilateral perianal region, and faint acetowhite epithelium on labia minora bilaterally Perianal vulvar, right-benign skin tissue with hyperkeratosis parakeratosis and chronic inflammation Labia majora, rightVIN-I/flat condyloma/HPV effect 12/10/2016 colposcopy, vulvar-faint acetowhite epithelium at the posterior fourchette only (see geographic mapping diagram 07/06/2017  colposcopy, normal; no biopsies 02/09/2018 colposcopy,labia majora notable for acetowhite epithelium in several punctate areas; right labia minora, and posterior fourchette has normal appearance; perianal region notable for multiple acetowhite punctate areas on right side.             Right labia majora biopsy-benign             Right perianal biopsy-VIN 1  Obstetric History OB History  Gravida Para Term Preterm AB Living  0 0 0 0 0 0  SAB TAB Ectopic Multiple Live Births  0 0 0 0      Past Medical History:  Diagnosis Date  . Cancer ([place])    basal cell  . GERD (gastroesophageal reflux disease)   . Hypothyroidism     Past Surgical History:  Procedure Laterality Date  . CHOLECYSTECTOMY, LAPAROSCOPIC  12/16   [facility]  . NASAL SEPTOPLASTY W/ TURBINOPLASTY  06/1986   reduction  . SHOULDER ARTHROSCOPY Right 03/2017  . SKIN GRAFT  11/2002   Nasal (for epistaxis)    Current Outpatient Medications on File Prior to Visit  Medication Sig Dispense Refill  . Cholecalciferol (D3-50) 50000 units capsule Take by mouth.    . levothyroxine (SYNTHROID, LEVOTHROID) 50 MCG tablet TAKE 1 TABLET BY MOUTH ONCE DAILY ON AN EMPTY STOMACH. WAIT 30 MINUTES BEFORE TAKING OTHER MEDS. 90 tablet 1  . Probiotic Product (ALIGN) 4 MG CAPS Take by mouth.    . triamcinolone cream (KENALOG) 0.1 % Apply 1 application topically 2 (two) times daily as needed. 45 g 1   No current facility-administered medications on file prior to visit.     Allergies  Allergen Reactions  . Acidophilus Other (See Comments)    Rectal spasms  . Aspirin     REACTION: nose bleeds  . Cefdinir Other (See Comments)  . Cephalosporins Other (See Comments)  . Nsaids Other (See Comments)    bleeding bleeding  . Tolmetin  Other reaction(s): Other (See Comments) bleeding    Social History   Socioeconomic History  . Marital status: Married    Spouse name: Not on file  . Number of children: 0  . Years of education: Not on file   . Highest education level: Not on file  Occupational History  . 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 Financial resource strain: Not on file  . Food insecurity:    Worry: Not on file    Inability: Not on file  . Transportation needs:    Medical: Not on file    Non-medical: Not on file  Tobacco Use  . Smoking status: Never Smoker  . Smokeless tobacco: Never Used  Substance and Sexual Activity  . Alcohol use: No    Alcohol/week: 0.0 standard drinks  . Drug use: No  . Sexual activity: Never  Lifestyle  . Physical activity:    Days per week: Not on file    Minutes per session: Not on file  . Stress: Not on file  Relationships  . Social connections:    Talks on phone: Not on file    Gets together: Not on file    Attends religious service: Not on file    Active member of club or organization: Not on file    Attends meetings of clubs or organizations: Not on file    Relationship status: Not on file  . Intimate partner violence:    Fear of current or ex partner: Not on file    Emotionally abused: Not on file    Physically abused: Not on file    Forced sexual activity: Not on file  Other Topics Concern  . Not on file  Social History Narrative   Regular exercise Yes    Family History  Problem Relation Age of Onset  . Heart disease Father        atrial fib  . Cancer Father        colon cancer  . Hypertension Father   . Stroke Father   . Heart disease Maternal Uncle        CAD  . Cancer Paternal Aunt        colon cancer  . Asthma Neg Hx   . Diabetes Neg Hx     The following portions of the patient's history were reviewed and updated as appropriate: allergies, current medications, past family history, past medical history, past social history, past surgical history and problem list.  Review of Systems Review of Systems  Constitutional: Negative.        No vasomotor symptoms  HENT: Negative.   Eyes: Negative.    Respiratory: Negative.   Cardiovascular: Negative.   Gastrointestinal: Positive for constipation. Negative for abdominal pain, blood in stool, diarrhea, nausea and vomiting.       Working on diet and weight gain  Genitourinary: Negative.   Musculoskeletal: Negative.        Healing metatarsal fracture foot  Skin: Negative.   Neurological: Negative.   Endo/Heme/Allergies: Negative.   Psychiatric/Behavioral: Negative.       Objective:  LMP  (LMP Unknown)  BP 104/72   Pulse 89   Ht 5\' 3"  (1.6 m)   Wt 110 lb 11.2 oz (50.2 kg)   LMP  (LMP Unknown)   BMI 19.61 kg/m menopause  CONSTITUTIONAL: Well-developed, slightly cachectic appearing emale in no acute distress.  PSYCHIATRIC: Normal mood and affect. Normal behavior. Normal judgment and thought content.  [place]: Alert and oriented to person, place, and time. Normal muscle tone coordination. No cranial nerve deficit noted. HENT:  Normocephalic, atraumatic, External right and left ear normal. Oropharynx is clear and moist EYES: Conjunctivae and EOM are normal No scleral icterus.  NECK: Normal range of motion, supple, no masses.  Thyroid palpable and symmetric SKIN: Skin is warm and dry. No rash noted. Not diaphoretic. No erythema. No pallor. CARDIOVASCULAR: Normal heart rate noted, regular rhythm, no murmur. RESPIRATORY: Clear to auscultation bilaterally. Effort and breath sounds normal, no problems with respiration noted. BREASTS: Symmetric in size. No masses, skin changes, nipple drainage, or lymphadenopathy. ABDOMEN: Soft, no distention noted.  No tenderness, rebound or guarding.  BLADDER: Normal PELVIC:  External Genitalia: Normal without lesions  BUS: Normal  Vagina: Moderate to severe atrophy  Cervix: Normal; Nulliparous; no lesions  Uterus: Normal; midplane, normal size, shape, mobile, nontender (single digit exam)  Adnexa: Normal; nonpalpable and nontender  RV: External Exam NormaI, No Rectal Masses and Normal Sphincter  tone  MUSCULOSKELETAL: Normal range of motion. No tenderness.  No cyanosis, clubbing, or edema.  2+ distal pulses. LYMPHATIC: No Axillary, Supraclavicular, or Inguinal Adenopathy.    Assessment:   Annual gynecologic examination 52 y.o. Contraception: menopause bmi- 19 Menopausal, not on HRT Vaginal atrophy Family history of osteoporosis; unable to take calcium Pathologic foot fracture-metatarsal  Plan:  Pap: Due 2021 Mammogram: Ordered Stool Guaiac Testing:  ordered Labs: thru pcp Routine preventative health maintenance measures emphasized: Exercise/Diet/Weight control, Tobacco Warnings and Alcohol/Substance use risks  DEXA scan Return to [place], CMA  Brayton Mars, MD   Note: This dictation was prepared with Dragon dictation along with smaller phrase technology. Any transcriptional errors that result from this process are unintentional.

## 2018-06-15 ENCOUNTER — Encounter: Payer: Self-pay | Admitting: Obstetrics and Gynecology

## 2018-06-15 ENCOUNTER — Ambulatory Visit (INDEPENDENT_AMBULATORY_CARE_PROVIDER_SITE_OTHER): Payer: BC Managed Care – PPO | Admitting: Obstetrics and Gynecology

## 2018-06-15 VITALS — BP 104/72 | HR 89 | Ht 63.0 in | Wt 110.7 lb

## 2018-06-15 DIAGNOSIS — N952 Postmenopausal atrophic vaginitis: Secondary | ICD-10-CM | POA: Diagnosis not present

## 2018-06-15 DIAGNOSIS — S92309A Fracture of unspecified metatarsal bone(s), unspecified foot, initial encounter for closed fracture: Secondary | ICD-10-CM

## 2018-06-15 DIAGNOSIS — Z01419 Encounter for gynecological examination (general) (routine) without abnormal findings: Secondary | ICD-10-CM

## 2018-06-15 DIAGNOSIS — Z780 Asymptomatic menopausal state: Secondary | ICD-10-CM | POA: Insufficient documentation

## 2018-06-15 DIAGNOSIS — Z1211 Encounter for screening for malignant neoplasm of colon: Secondary | ICD-10-CM | POA: Diagnosis not present

## 2018-06-15 DIAGNOSIS — Z1231 Encounter for screening mammogram for malignant neoplasm of breast: Secondary | ICD-10-CM | POA: Diagnosis not present

## 2018-06-15 DIAGNOSIS — N900 Mild vulvar dysplasia: Secondary | ICD-10-CM

## 2018-06-15 DIAGNOSIS — Z1239 Encounter for other screening for malignant neoplasm of breast: Secondary | ICD-10-CM

## 2018-06-15 DIAGNOSIS — Z01411 Encounter for gynecological examination (general) (routine) with abnormal findings: Secondary | ICD-10-CM

## 2018-06-15 DIAGNOSIS — Z8262 Family history of osteoporosis: Secondary | ICD-10-CM | POA: Insufficient documentation

## 2018-06-15 DIAGNOSIS — R6252 Short stature (child): Secondary | ICD-10-CM

## 2018-06-15 NOTE — Patient Instructions (Addendum)
1.  Pap smear not done.  Next Pap smear due 2021. 2.  Mammogram is ordered. 3.  Stool guaiac cards are given for colon cancer screening. 4.  Screening labs are to be obtained through primary care. 5.  Continue with healthy eating and exercise. 6.  Return in 1 year for annual exam 7.  DEXA scan due to family history of osteoporosis and recent pathologic fracture of foot.   Health Maintenance for Postmenopausal Women Menopause is a normal process in which your reproductive ability comes to an end. This process happens gradually over a span of months to years, usually between the ages of 68 and 46. Menopause is complete when you have missed 12 consecutive menstrual periods. It is important to talk with your health care provider about some of the most common conditions that affect postmenopausal women, such as heart disease, cancer, and bone loss (osteoporosis). Adopting a healthy lifestyle and getting preventive care can help to promote your health and wellness. Those actions can also lower your chances of developing some of these common conditions. What should I know about menopause? During menopause, you may experience a number of symptoms, such as:  Moderate-to-severe hot flashes.  Night sweats.  Decrease in sex drive.  Mood swings.  Headaches.  Tiredness.  Irritability.  Memory problems.  Insomnia.  Choosing to treat or not to treat menopausal changes is an individual decision that you make with your health care provider. What should I know about hormone replacement therapy and supplements? Hormone therapy products are effective for treating symptoms that are associated with menopause, such as hot flashes and night sweats. Hormone replacement carries certain risks, especially as you become older. If you are thinking about using estrogen or estrogen with progestin treatments, discuss the benefits and risks with your health care provider. What should I know about heart disease and  stroke? Heart disease, heart attack, and stroke become more likely as you age. This may be due, in part, to the hormonal changes that your body experiences during menopause. These can affect how your body processes dietary fats, triglycerides, and cholesterol. Heart attack and stroke are both medical emergencies. There are many things that you can do to help prevent heart disease and stroke:  Have your blood pressure checked at least every 1-2 years. High blood pressure causes heart disease and increases the risk of stroke.  If you are 32-71 years old, ask your health care provider if you should take aspirin to prevent a heart attack or a stroke.  Do not use any tobacco products, including cigarettes, chewing tobacco, or electronic cigarettes. If you need help quitting, ask your health care provider.  It is important to eat a healthy diet and maintain a healthy weight. ? Be sure to include plenty of vegetables, fruits, low-fat dairy products, and lean protein. ? Avoid eating foods that are high in solid fats, added sugars, or salt (sodium).  Get regular exercise. This is one of the most important things that you can do for your health. ? Try to exercise for at least 150 minutes each week. The type of exercise that you do should increase your heart rate and make you sweat. This is known as moderate-intensity exercise. ? Try to do strengthening exercises at least twice each week. Do these in addition to the moderate-intensity exercise.  Know your numbers.Ask your health care provider to check your cholesterol and your blood glucose. Continue to have your blood tested as directed by your health care provider.  What should I know about cancer screening? There are several types of cancer. Take the following steps to reduce your risk and to catch any cancer development as early as possible. Breast Cancer  Practice breast self-awareness. ? This means understanding how your breasts normally appear  and feel. ? It also means doing regular breast self-exams. Let your health care provider know about any changes, no matter how small.  If you are 11 or older, have a clinician do a breast exam (clinical breast exam or CBE) every year. Depending on your age, family history, and medical history, it may be recommended that you also have a yearly breast X-ray (mammogram).  If you have a family history of breast cancer, talk with your health care provider about genetic screening.  If you are at high risk for breast cancer, talk with your health care provider about having an MRI and a mammogram every year.  Breast cancer (BRCA) gene test is recommended for women who have family members with BRCA-related cancers. Results of the assessment will determine the need for genetic counseling and BRCA1 and for BRCA2 testing. BRCA-related cancers include these types: ? Breast. This occurs in males or females. ? Ovarian. ? Tubal. This may also be called fallopian tube cancer. ? Cancer of the abdominal or pelvic lining (peritoneal cancer). ? Prostate. ? Pancreatic.  Cervical, Uterine, and Ovarian Cancer Your health care provider may recommend that you be screened regularly for cancer of the pelvic organs. These include your ovaries, uterus, and vagina. This screening involves a pelvic exam, which includes checking for microscopic changes to the surface of your cervix (Pap test).  For women ages 21-65, health care providers may recommend a pelvic exam and a Pap test every three years. For women ages 74-65, they may recommend the Pap test and pelvic exam, combined with testing for human papilloma virus (HPV), every five years. Some types of HPV increase your risk of cervical cancer. Testing for HPV may also be done on women of any age who have unclear Pap test results.  Other health care providers may not recommend any screening for nonpregnant women who are considered low risk for pelvic cancer and have no  symptoms. Ask your health care provider if a screening pelvic exam is right for you.  If you have had past treatment for cervical cancer or a condition that could lead to cancer, you need Pap tests and screening for cancer for at least 20 years after your treatment. If Pap tests have been discontinued for you, your risk factors (such as having a new sexual partner) need to be reassessed to determine if you should start having screenings again. Some women have medical problems that increase the chance of getting cervical cancer. In these cases, your health care provider may recommend that you have screening and Pap tests more often.  If you have a family history of uterine cancer or ovarian cancer, talk with your health care provider about genetic screening.  If you have vaginal bleeding after reaching menopause, tell your health care provider.  There are currently no reliable tests available to screen for ovarian cancer.  Lung Cancer Lung cancer screening is recommended for adults 15-8 years old who are at high risk for lung cancer because of a history of smoking. A yearly low-dose CT scan of the lungs is recommended if you:  Currently smoke.  Have a history of at least 30 pack-years of smoking and you currently smoke or have quit within the past  15 years. A pack-year is smoking an average of one pack of cigarettes per day for one year.  Yearly screening should:  Continue until it has been 15 years since you quit.  Stop if you develop a health problem that would prevent you from having lung cancer treatment.  Colorectal Cancer  This type of cancer can be detected and can often be prevented.  Routine colorectal cancer screening usually begins at age 66 and continues through age 56.  If you have risk factors for colon cancer, your health care provider may recommend that you be screened at an earlier age.  If you have a family history of colorectal cancer, talk with your health care  provider about genetic screening.  Your health care provider may also recommend using home test kits to check for hidden blood in your stool.  A small camera at the end of a tube can be used to examine your colon directly (sigmoidoscopy or colonoscopy). This is done to check for the earliest forms of colorectal cancer.  Direct examination of the colon should be repeated every 5-10 years until age 73. However, if early forms of precancerous polyps or small growths are found or if you have a family history or genetic risk for colorectal cancer, you may need to be screened more often.  Skin Cancer  Check your skin from head to toe regularly.  Monitor any moles. Be sure to tell your health care provider: ? About any new moles or changes in moles, especially if there is a change in a mole's shape or color. ? If you have a mole that is larger than the size of a pencil eraser.  If any of your family members has a history of skin cancer, especially at a young age, talk with your health care provider about genetic screening.  Always use sunscreen. Apply sunscreen liberally and repeatedly throughout the day.  Whenever you are outside, protect yourself by wearing long sleeves, pants, a wide-brimmed hat, and sunglasses.  What should I know about osteoporosis? Osteoporosis is a condition in which bone destruction happens more quickly than new bone creation. After menopause, you may be at an increased risk for osteoporosis. To help prevent osteoporosis or the bone fractures that can happen because of osteoporosis, the following is recommended:  If you are 15-39 years old, get at least 1,000 mg of calcium and at least 600 mg of vitamin D per day.  If you are older than age 73 but younger than age 8, get at least 1,200 mg of calcium and at least 600 mg of vitamin D per day.  If you are older than age 48, get at least 1,200 mg of calcium and at least 800 mg of vitamin D per day.  Smoking and excessive  alcohol intake increase the risk of osteoporosis. Eat foods that are rich in calcium and vitamin D, and do weight-bearing exercises several times each week as directed by your health care provider. What should I know about how menopause affects my mental health? Depression may occur at any age, but it is more common as you become older. Common symptoms of depression include:  Low or sad mood.  Changes in sleep patterns.  Changes in appetite or eating patterns.  Feeling an overall lack of motivation or enjoyment of activities that you previously enjoyed.  Frequent crying spells.  Talk with your health care provider if you think that you are experiencing depression. What should I know about immunizations? It is important  that you get and maintain your immunizations. These include:  Tetanus, diphtheria, and pertussis (Tdap) booster vaccine.  Influenza every year before the flu season begins.  Pneumonia vaccine.  Shingles vaccine.  Your health care provider may also recommend other immunizations. This information is not intended to replace advice given to you by your health care provider. Make sure you discuss any questions you have with your health care provider. Document Released: 11/28/2005 Document Revised: 04/25/2016 Document Reviewed: 07/10/2015 Elsevier Interactive Patient Education  2018 [employer]  

## 2018-07-22 ENCOUNTER — Encounter: Payer: Self-pay | Admitting: Internal Medicine

## 2018-07-22 ENCOUNTER — Ambulatory Visit (INDEPENDENT_AMBULATORY_CARE_PROVIDER_SITE_OTHER): Payer: BC Managed Care – PPO | Admitting: Internal Medicine

## 2018-07-22 VITALS — BP 102/70 | HR 81 | Temp 98.1°F | Ht 63.0 in | Wt 110.0 lb

## 2018-07-22 DIAGNOSIS — E039 Hypothyroidism, unspecified: Secondary | ICD-10-CM | POA: Diagnosis not present

## 2018-07-22 DIAGNOSIS — Z0000 Encounter for general adult medical examination without abnormal findings: Secondary | ICD-10-CM

## 2018-07-22 MED ORDER — LEVOTHYROXINE SODIUM 50 MCG PO TABS: ORAL_TABLET | ORAL | 3 refills | Status: DC

## 2018-07-22 NOTE — Assessment & Plan Note (Signed)
Labs in April were fine No dose change needed

## 2018-07-22 NOTE — Assessment & Plan Note (Signed)
Healthy Some muscle loss with the weight---discussed adequate protein and resistance training Will be settling up mammogram Colon due 2021 (due to [place] in dad) Sees gyn Had flu vaccine

## 2018-07-22 NOTE — Progress Notes (Signed)
Subjective:    Patient ID: Amy Riley, female    DOB: July 10, 1966, 52 y.o.   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 levothyroxine (SYNTHROID, LEVOTHROID) 50 MCG tablet TAKE 1 TABLET BY MOUTH ONCE DAILY ON AN EMPTY STOMACH. WAIT 30 MINUTES BEFORE TAKING OTHER MEDS. 90 tablet 1   No current facility-administered medications on file prior to visit.     Allergies  Allergen Reactions  . Acidophilus Other (See Comments)    Rectal spasms  . Aspirin     REACTION: nose bleeds  . Cefdinir Other (See Comments)  . Cephalosporins Other (See Comments)  . Nsaids Other (See Comments)    bleeding bleeding  . Tolmetin     Other reaction(s): Other (See Comments) bleeding    Past Medical History:  Diagnosis Date  . Broken foot   . Cancer ([place])    basal cell  . GERD (gastroesophageal reflux disease)   . Hypothyroidism     Past Surgical History:  Procedure Laterality Date  . CHOLECYSTECTOMY, LAPAROSCOPIC  12/16   [facility]  . NASAL SEPTOPLASTY W/ TURBINOPLASTY  06/1986   reduction  . SHOULDER ARTHROSCOPY Right 03/2017  . SKIN GRAFT  11/2002   Nasal (for epistaxis)    Family History  Problem Relation Age of Onset  . Heart disease Father        atrial fib  . Cancer Father        colon cancer  . Hypertension Father   . Stroke Father   . Heart disease Maternal Uncle        CAD  . Cancer  Paternal Aunt        colon cancer  . Asthma Neg Hx   . Diabetes Neg Hx   . Ovarian cancer Neg Hx     Social History   Socioeconomic History  . Marital status: Married    Spouse name: Not on file  . Number of children: 0  . Years of education: Not on file  . Highest education level: Not on file  Occupational History  . 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 Financial resource strain: Not on file  . Food insecurity:    Worry: Not on file    Inability: Not on file  . Transportation needs:    Medical: Not on file    Non-medical: Not on file  Tobacco Use  . Smoking status: Never Smoker  . Smokeless tobacco: Never Used  Substance and Sexual Activity  . Alcohol use: No    Alcohol/week: 0.0 standard drinks  . Drug use: No  . Sexual activity: Not Currently  Lifestyle  . Physical activity:    Days per week: 2 days    Minutes per session: 30 min  . Stress: Not on file  Relationships  . Social connections:    Talks on phone: Not on file    Gets together: Not  on file    Attends religious service: Not on file    Active member of club or organization: Not on file    Attends meetings of clubs or organizations: Not on file    Relationship status: Not on file  . Intimate partner violence:    Fear of current or ex partner: Not on file    Emotionally abused: Not on file    Physically abused: Not on file    Forced sexual activity: Not on file  Other Topics Concern  . Not on file  Social History Narrative   Regular exercise Yes   Review of Systems  Constitutional: Negative for fatigue.       Wears seat belt  HENT: Positive for tinnitus. Negative for hearing loss.        Braces are straightening her teeth Keeps up with dentist  Eyes: Negative for visual disturbance.       No diplopia or unilateral vision loss  Respiratory: Negative for cough, chest tightness and shortness of breath.   Cardiovascular: Positive for palpitations.  Negative for chest pain and leg swelling.  Gastrointestinal: Negative for blood in stool and nausea.  Endocrine: Negative for polydipsia and polyuria.  Genitourinary: Negative for difficulty urinating, dyspareunia, dysuria and hematuria.  Musculoskeletal: Positive for arthralgias. Negative for joint swelling.       Chiropractor once a month  Skin: Negative for rash.       Dry skin  Allergic/Immunologic: Positive for environmental allergies. Negative for immunocompromised state.       Generally does without meds  Neurological: Negative for dizziness, syncope and light-headedness.       Occ seasonal headaches in fall---tylenol helps  Hematological: Negative for adenopathy. Bruises/bleeds easily.  Psychiatric/Behavioral: Negative for dysphoric mood. The patient is not nervous/anxious.        Sleeps well with 1 tylenol PM       Objective:   Physical Exam  Constitutional: She is oriented to person, place, and time. She appears well-developed. No distress.  HENT:  Head: Normocephalic and atraumatic.  Right Ear: External ear normal.  Left Ear: External ear normal.  Mouth/Throat: Oropharynx is clear and moist.  Eyes: Pupils are equal, round, and reactive to light. Conjunctivae are normal.  Neck: No thyromegaly present.  Cardiovascular: Normal rate, regular rhythm, normal heart sounds and intact distal pulses. Exam reveals no gallop.  No murmur heard. Respiratory: Effort normal and breath sounds normal. No respiratory distress. She has no wheezes. She has no rales.  GI: Soft. There is no tenderness.  Musculoskeletal: She exhibits no edema or tenderness.  Lymphadenopathy:    She has no cervical adenopathy.  Neurological: She is alert and oriented to person, place, and time.  Skin: No rash noted. No erythema.  Scattered cherry angiomas  Psychiatric: She has a normal mood and affect. Her behavior is normal.           Assessment & Plan:

## 2018-07-29 ENCOUNTER — Encounter

## 2018-07-29 ENCOUNTER — Encounter: Payer: BC Managed Care – PPO | Admitting: Internal Medicine

## 2018-08-18 ENCOUNTER — Encounter: Payer: BC Managed Care – PPO | Admitting: Obstetrics and Gynecology

## 2018-08-25 ENCOUNTER — Encounter: Payer: Self-pay | Admitting: Obstetrics and Gynecology

## 2018-08-25 ENCOUNTER — Other Ambulatory Visit (HOSPITAL_COMMUNITY)
Admission: RE | Admit: 2018-08-25 | Discharge: 2018-08-25 | Disposition: A | Discharge status: Home / Self Care | Payer: BC Managed Care – PPO | Source: Ambulatory Visit | Attending: Obstetrics and Gynecology | Admitting: Obstetrics and Gynecology

## 2018-08-25 ENCOUNTER — Ambulatory Visit (INDEPENDENT_AMBULATORY_CARE_PROVIDER_SITE_OTHER): Payer: BC Managed Care – PPO | Admitting: Obstetrics and Gynecology

## 2018-08-25 VITALS — BP 104/65 | HR 85 | Ht 63.0 in | Wt 112.0 lb

## 2018-08-25 DIAGNOSIS — N9089 Other specified noninflammatory disorders of vulva and perineum: Secondary | ICD-10-CM | POA: Diagnosis present

## 2018-08-25 DIAGNOSIS — N900 Mild vulvar dysplasia: Secondary | ICD-10-CM | POA: Diagnosis not present

## 2018-08-25 NOTE — Progress Notes (Signed)
Chief complaint: 1.  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  Interval colposcopy  SUBJECTIVE: Patient reports no interval history of burning, itching, or vaginal/vulvar pain.  Patient is not sexually active with intercourse.  Patient reports no urogenital atrophy symptoms at the present time.   Gynecologic History No LMP recorded (lmp unknown). Patient is postmenopausal. Contraception: none Last Pap: 06/10/2017 neg/neg. Results were: normal Last mammogram: 06/2017 birad 1 . Results were: normal History of VIN 3  Vulvar pathology history: 07/06/2017 colposcopy-normal vulvar colposcopy; no biopsies 12/04/2015 posterior fourchette, right side, 5 mm grayish white lesion, 5 mm Gray mole Posterior fourchette , inferior-VIN-I Posterior fourchette, superior-VIN-I 03/06/2016 colposcopy, vulvar-normal (no biopsies) 09/04/2016 colposcopy, vulvar-idffuse white epithelium/papillation affect right labia majora, partially on left labia majora, bilateral perianal region, and faint acetowhite epithelium on labia minora bilaterally Perianal vulvar, right-benign skin tissue with hyperkeratosis parakeratosis and chronic inflammation Labia majora, rightVIN-I/flat condyloma/HPV effect 12/10/2016 colposcopy, vulvar-faint acetowhite epithelium at the posterior fourchette only (see geographic mapping diagram 07/06/2017 colposcopy, normal; no biopsies 02/09/2018 colposcopy,labia majoranotable for acetowhite epithelium in several punctate areas;rightlabia minora, and posterior fourchette has normal appearance;perianal region notable for multiple acetowhite punctate areas on right side. Right labia majora biopsy-benign Right perianal biopsy-VIN 1 08/25/2018 colposcopy, vulvar with biopsy: Findings: Right labia majora with semi-lunar area of acetowhite papillation/tufting extending towards the posterior fourchette.  Perianal region notable for several  punctate areas of acetowhite glandular tufting Biopsies:  Right labia majora-  OBJECTIVE: BP 104/65   Pulse 85   Ht 5\' 3"  (1.6 m)   Wt 112 lb (50.8 kg)   LMP  (LMP Unknown)   BMI 19.84 kg/m  Pleasant well-appearing thin female in no acute distress.  Alert and oriented. Pelvic exam: External genitalia-no leukoplakia, erythema, or ulceration BUS-normal Vagina-moderate to severe atrophy Cervix-not examined Uterus-not examined Adnexa-not examined Rectovaginal-external exam normal  COLPOSCOPY: Vulvar colposcopy with biopsy Indications: History of VIN 3 Findings: Right labia majora with semi-lunar area of acetowhite papillation/tufting extending towards the posterior fourchette.  Perianal region notable for several punctate areas of acetowhite glandular tufting Biopsies:  Right labia majora Procedure: Verbal consent is obtained.  Patient is placed in dorsolithotomy position.  The perineum is soaked with 4 x 4 pads moistened with acetic acid for 2 minutes.  Colposcopy of the vulva is performed with the above-noted findings being made.  3 mm punch biopsy is taken of the right labia majora following infiltration of 2 cc of lidocaine without epinephrine.  Biopsy sent to pathology.  Silver nitrate stick is used for hemostasis of capillary bleeding.  Procedure is well-tolerated.  Blood loss is minimal.  ASSESSMENT: 1.  History of VIN 3 2.  Clinically asymptomatic 3.  Colposcopy notable for semilunar area of acetowhite glandular type tufting along right labia majora extending towards the posterior fourchette, as well as a few punctate areas of acetowhite glandular type tufting noted in the perianal region  PLAN: 1.  Right labia majora 3 mm punch biopsy 2.  Results to be made available through my chart 3.  Return in 6 months for interval vulvar colposcopy (patient will likely transfer care to [facility] due to my departure from clinical practice at [facility]) 4.  Should  high-grade VIN be identified, patient will need follow-up with GYN oncology for consideration of possible laser treatment of the vulvar dysplasia.  Brayton Mars, MD  Note: This dictation was prepared with Dragon dictation along with smaller phrase technology. Any transcriptional errors that result from this process  are unintentional.

## 2018-09-01 ENCOUNTER — Telehealth: Payer: Self-pay | Admitting: Obstetrics and Gynecology

## 2018-09-01 NOTE — Telephone Encounter (Signed)
Vulvar biopsy consistent with VIN 1. My chart message is sent.

## 2018-09-27 ENCOUNTER — Encounter: Payer: Self-pay | Admitting: Internal Medicine

## 2018-09-27 ENCOUNTER — Ambulatory Visit: Payer: BC Managed Care – PPO | Admitting: Internal Medicine

## 2018-09-27 VITALS — BP 124/80 | HR 87 | Temp 98.4°F | Ht 63.0 in | Wt 114.0 lb

## 2018-09-27 DIAGNOSIS — H00019 Hordeolum externum unspecified eye, unspecified eyelid: Secondary | ICD-10-CM | POA: Diagnosis not present

## 2018-09-27 MED ORDER — ERYTHROMYCIN 5 MG/GM OP OINT: 1.0000 "application " | TOPICAL_OINTMENT | Freq: Three times a day (TID) | OPHTHALMIC | 1 refills | Status: DC

## 2018-09-27 NOTE — Progress Notes (Signed)
Subjective:    Patient ID: Amy Riley, female    DOB: [birth year], 52 y.o.   MRN: [id number]  HPI Here due to eye problems Thinks she has styes in both  Has recently started macu-health vitamins (diagnosed with macular degeneration on screening test)  Notices painful areas on both eyelids Some pus bumps that drained (right lateral lower, left upper lateral) Using warm compresses No redness in white of eye--but they were puffy  Current Outpatient Medications on File Prior to Visit  Medication Sig Dispense Refill  . levothyroxine (SYNTHROID, LEVOTHROID) 50 MCG tablet TAKE 1 TABLET BY MOUTH ONCE DAILY ON AN EMPTY STOMACH. WAIT 30 MINUTES BEFORE TAKING OTHER MEDS. 90 tablet 3   No current facility-administered medications on file prior to visit.     Allergies  Allergen Reactions  . Acidophilus Other (See Comments)    Rectal spasms  . Aspirin     REACTION: nose bleeds  . Cefdinir Other (See Comments)  . Cephalosporins Other (See Comments)  . Nsaids Other (See Comments)    bleeding bleeding  . Tolmetin     Other reaction(s): Other (See Comments) bleeding    Past Medical History:  Diagnosis Date  . Broken foot   . Cancer ([place])    basal cell  . GERD (gastroesophageal reflux disease)   . Hypothyroidism     Past Surgical History:  Procedure Laterality Date  . CHOLECYSTECTOMY, LAPAROSCOPIC  12/16   [facility]  . NASAL SEPTOPLASTY W/ TURBINOPLASTY  06/1986   reduction  . SHOULDER ARTHROSCOPY Right 03/2017  . SKIN GRAFT  11/2002   Nasal (for epistaxis)    Family History  Problem Relation Age of Onset  . Heart disease Father        atrial fib  . Cancer Father        colon cancer  . Hypertension Father   . Stroke Father   . Heart disease Maternal Uncle        CAD  . Cancer Paternal Aunt        colon cancer  . Asthma Neg Hx   . Diabetes Neg Hx   . Ovarian cancer Neg Hx     Social History   Socioeconomic History  . Marital status: Married    Spouse name:  Not on file  . Number of children: 0  . Years of education: Not on file  . Highest education level: Not on file  Occupational History  . 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 Financial resource strain: Not on file  . Food insecurity:    Worry: Not on file    Inability: Not on file  . Transportation needs:    Medical: Not on file    Non-medical: Not on file  Tobacco Use  . Smoking status: Never Smoker  . Smokeless tobacco: Never Used  Substance and Sexual Activity  . Alcohol use: No    Alcohol/week: 0.0 standard drinks  . Drug use: No  . Sexual activity: Not Currently  Lifestyle  . Physical activity:    Days per week: 2 days    Minutes per session: 30 min  . Stress: Not on file  Relationships  . Social connections:    Talks on phone: Not on file    Gets together: Not on file    Attends religious service: Not on file    Active member of club or organization: Not on file    Attends meetings  of clubs or organizations: Not on file    Relationship status: Not on file  . Intimate partner violence:    Fear of current or ex partner: Not on file    Emotionally abused: Not on file    Physically abused: Not on file    Forced sexual activity: Not on file  Other Topics Concern  . Not on file  Social History Narrative   Regular exercise Yes   Review of Systems  Did have conjunctivitis back in January--not like that No fever No new eye makeup--threw away the eyeliner she was using    Objective:   Physical Exam  Eyes:  Small stye on right lower lid---some conjunctival injection Smaller pointing stye on the inside of left upper lid---without much inflammation           Assessment & Plan:

## 2018-09-27 NOTE — Assessment & Plan Note (Signed)
Bilateral ?related to some kind of contact--she doesn't use much makeup though Got rid of eye liner Continue warm compresses erythro ointment for bedtime and tid if more inflammation

## 2018-10-07 ENCOUNTER — Other Ambulatory Visit: Payer: BC Managed Care – PPO

## 2018-10-26 ENCOUNTER — Ambulatory Visit: Payer: BC Managed Care – PPO | Admitting: Internal Medicine

## 2018-10-26 ENCOUNTER — Encounter: Payer: Self-pay | Admitting: Internal Medicine

## 2018-10-26 VITALS — BP 116/76 | HR 85 | Temp 98.3°F | Ht 63.0 in | Wt 113.0 lb

## 2018-10-26 DIAGNOSIS — K140 Glossitis: Secondary | ICD-10-CM | POA: Diagnosis not present

## 2018-10-26 NOTE — Progress Notes (Signed)
Subjective:    Patient ID: Amy Riley, female    DOB: August 02, 1966, 53 y.o.   MRN: [id number]  HPI Here due to problems with her tongue  Noticed something after starting Invisalign Started 1 year ago---occasional irritation from the plastic but generally tolerated Had been wearing 22 hours per day--but changed to just nighttime After this, she started having the tongue symptoms Burning sensation (like if she had burned her tongue) Aggravated by salt, spicy food, her toothpaste Does have redness on her tongue  Current Outpatient Medications on File Prior to Visit  Medication Sig Dispense Refill  . erythromycin ophthalmic ointment Place 1 application into both eyes 3 (three) times daily. 3.5 g 1  . levothyroxine (SYNTHROID, LEVOTHROID) 50 MCG tablet TAKE 1 TABLET BY MOUTH ONCE DAILY ON AN EMPTY STOMACH. WAIT 30 MINUTES BEFORE TAKING OTHER MEDS. 90 tablet 3   No current facility-administered medications on file prior to visit.     Allergies  Allergen Reactions  . Acidophilus Other (See Comments)    Rectal spasms  . Aspirin     REACTION: nose bleeds  . Cefdinir Other (See Comments)  . Cephalosporins Other (See Comments)  . Nsaids Other (See Comments)    bleeding bleeding  . Tolmetin     Other reaction(s): Other (See Comments) bleeding    Past Medical History:  Diagnosis Date  . Broken foot   . Cancer ([place])    basal cell  . GERD (gastroesophageal reflux disease)   . Hypothyroidism     Past Surgical History:  Procedure Laterality Date  . CHOLECYSTECTOMY, LAPAROSCOPIC  12/16   [facility]  . NASAL SEPTOPLASTY W/ TURBINOPLASTY  06/1986   reduction  . SHOULDER ARTHROSCOPY Right 03/2017  . SKIN GRAFT  11/2002   Nasal (for epistaxis)    Family History  Problem Relation Age of Onset  . Heart disease Father        atrial fib  . Cancer Father        colon cancer  . Hypertension Father   . Stroke Father   . Heart disease Maternal Uncle        CAD  . Cancer Paternal  Aunt        colon cancer  . Asthma Neg Hx   . Diabetes Neg Hx   . Ovarian cancer Neg Hx     Social History   Socioeconomic History  . Marital status: Married    Spouse name: Not on file  . Number of children: 0  . Years of education: Not on file  . Highest education level: Not on file  Occupational History  . 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 Financial resource strain: Not on file  . Food insecurity:    Worry: Not on file    Inability: Not on file  . Transportation needs:    Medical: Not on file    Non-medical: Not on file  Tobacco Use  . Smoking status: Never Smoker  . Smokeless tobacco: Never Used  Substance and Sexual Activity  . Alcohol use: No    Alcohol/week: 0.0 standard drinks  . Drug use: No  . Sexual activity: Not Currently  Lifestyle  . Physical activity:    Days per week: 2 days    Minutes per session: 30 min  . Stress: Not on file  Relationships  . Social connections:    Talks on phone: Not on file    Gets together:  Not on file    Attends religious service: Not on file    Active member of club or organization: Not on file    Attends meetings of clubs or organizations: Not on file    Relationship status: Not on file  . Intimate partner violence:    Fear of current or ex partner: Not on file    Emotionally abused: Not on file    Physically abused: Not on file    Forced sexual activity: Not on file  Other Topics Concern  . Not on file  Social History Narrative   Regular exercise Yes   Review of Systems  Eating okay Weight up slightly GI system "under control now"     Objective:   Physical Exam  Constitutional: She appears well-developed. No distress.  HENT:  Redness on tip of tongue---1/3 Also on underside No induration or mass palpated           Assessment & Plan:

## 2018-10-26 NOTE — Assessment & Plan Note (Signed)
She was concerned about burning tongue syndrome--but she truly has redness of the distal tongue. Has the same Invisalign tray for an extended time----?some type of contact problem Going back to orthodontist tomorrow---will need to discuss with them Recommended trying a multivitamin just in case

## 2018-11-16 ENCOUNTER — Ambulatory Visit
Admission: RE | Admit: 2018-11-16 | Discharge: 2018-11-16 | Disposition: A | Discharge status: Home / Self Care | Payer: BC Managed Care – PPO | Source: Ambulatory Visit | Attending: Obstetrics and Gynecology | Admitting: Obstetrics and Gynecology

## 2018-11-16 DIAGNOSIS — Z780 Asymptomatic menopausal state: Secondary | ICD-10-CM | POA: Insufficient documentation

## 2018-11-16 DIAGNOSIS — R6252 Short stature (child): Secondary | ICD-10-CM | POA: Insufficient documentation

## 2018-11-16 DIAGNOSIS — Z1239 Encounter for other screening for malignant neoplasm of breast: Secondary | ICD-10-CM | POA: Diagnosis present

## 2018-11-16 DIAGNOSIS — S92309A Fracture of unspecified metatarsal bone(s), unspecified foot, initial encounter for closed fracture: Secondary | ICD-10-CM | POA: Insufficient documentation

## 2018-11-22 ENCOUNTER — Telehealth: Payer: Self-pay | Admitting: Obstetrics and Gynecology

## 2018-11-22 NOTE — Telephone Encounter (Signed)
The patient called and stated that she never heard back from anyone in regards to her mammogram results. The patient would like a call back as soon as possible being that she hasn't heard anything for quite some time. Please advise.

## 2018-11-25 ENCOUNTER — Telehealth: Payer: Self-pay

## 2018-11-25 NOTE — Telephone Encounter (Signed)
Pt called and went over test results of her mammogram and bone density.

## 2018-11-25 NOTE — Telephone Encounter (Signed)
Pt called and went over test results.  

## 2018-12-30 ENCOUNTER — Encounter: Payer: Self-pay | Admitting: Emergency Medicine

## 2018-12-30 ENCOUNTER — Other Ambulatory Visit: Payer: Self-pay

## 2018-12-30 ENCOUNTER — Ambulatory Visit
Admission: EM | Admit: 2018-12-30 | Discharge: 2018-12-30 | Disposition: A | Discharge status: Home / Self Care | Payer: BC Managed Care – PPO | Attending: Internal Medicine | Admitting: Internal Medicine

## 2018-12-30 DIAGNOSIS — R0982 Postnasal drip: Secondary | ICD-10-CM

## 2018-12-30 DIAGNOSIS — J3489 Other specified disorders of nose and nasal sinuses: Secondary | ICD-10-CM

## 2018-12-30 DIAGNOSIS — R0981 Nasal congestion: Secondary | ICD-10-CM | POA: Diagnosis not present

## 2018-12-30 DIAGNOSIS — J069 Acute upper respiratory infection, unspecified: Secondary | ICD-10-CM

## 2018-12-30 NOTE — ED Triage Notes (Signed)
Patient c/o possible sinus infection. Patient states she woke up Monday morning with nasal congestion and facial pain/pressure. Denies fever. Patient has been taking Tylenol OTC for her symptoms.

## 2018-12-30 NOTE — Discharge Instructions (Addendum)
Supportive care. Rest. Drink plenty of fluids.   Follow up with your primary care physician this week as needed. Return to Urgent care for new or worsening concerns.

## 2018-12-30 NOTE — ED Provider Notes (Signed)
[facility] ____________________________________________  Time seen: Approximately 1:13 PM  I have reviewed the triage vital signs and the nursing notes.   HISTORY  Chief Complaint Nasal Congestion and Facial Pain   HPI Amy Riley is a 53 y.o. female presenting for evaluation of nasal congestion, nasal drainage and sinus discomfort present since Monday.  States in the last 2 days has been getting out more nasal congestion and drainage.  Some intermittent sore throat, worse in the morning with drainage.  Has overall continued to eat and drink well.  States concerned about having a sinus infection as she has had numerous in the past.  Has not been taken over-the-counter medications for the same complaints.  Some coughing.  Denies chest pain or shortness of breath.  Some body aches but denies known fevers.  Denies home sick contacts, possible work sick contacts.  Denies other relieving factors.  Venia Carbon, MD: PCP   Past Medical History:  Diagnosis Date  . Broken foot   . Cancer ([place])    basal cell  . GERD (gastroesophageal reflux disease)   . Hypothyroidism     Patient Active Problem List   Diagnosis Date Noted  . Glossitis 10/26/2018  . Hordeolum 09/27/2018  . Family history of osteoporosis in father 06/15/2018  . Closed fracture of metatarsal bone 06/15/2018  . Menopause 06/15/2018  . Vaginal atrophy 06/15/2018  . Acute vulvitis 01/05/2018  . 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 Chronic rhinitis 06/09/2013  . Epistaxis 06/09/2013  . Cervicalgia 01/14/2013  . Disorder of bursae and tendons in shoulder region 02/27/2012  . Basal cell carcinoma of face 12/16/2011  . Routine general medical examination at a health care facility 02/24/2011  . PREMENSTRUAL SYNDROME 04/19/2010  . Hypothyroidism 01/31/2009  . GERD 01/31/2009    Past Surgical History:  Procedure Laterality Date  .  CHOLECYSTECTOMY, LAPAROSCOPIC  12/16   [facility]  . NASAL SEPTOPLASTY W/ TURBINOPLASTY  06/1986   reduction  . SHOULDER ARTHROSCOPY Right 03/2017  . SKIN GRAFT  11/2002   Nasal (for epistaxis)     No current facility-administered medications for this encounter.   Current Outpatient Medications:  .  levothyroxine (SYNTHROID, LEVOTHROID) 50 MCG tablet, TAKE 1 TABLET BY MOUTH ONCE DAILY ON AN EMPTY STOMACH. WAIT 30 MINUTES BEFORE TAKING OTHER MEDS., Disp: 90 tablet, Rfl: 3 .  erythromycin ophthalmic ointment, Place 1 application into both eyes 3 (three) times daily., Disp: 3.5 g, Rfl: 1  Allergies Acidophilus; Aspirin; Cefdinir; Cephalosporins; Nsaids; and Tolmetin  Family History  Problem Relation Age of Onset  . Heart disease Father        atrial fib  . Cancer Father        colon cancer  . Hypertension Father   . Stroke Father   . Heart disease Maternal Uncle        CAD  . Cancer Paternal Aunt        colon cancer  . Asthma Neg Hx   . Diabetes Neg Hx   . Ovarian cancer Neg Hx     Social History Social History   Tobacco Use  . Smoking status: Never Smoker  . Smokeless tobacco: Never Used  Substance Use Topics  . Alcohol use: No    Alcohol/week: 0.0 standard drinks  . Drug use: No    Review of Systems Constitutional: No fever ENT: As above. Cardiovascular: Denies chest pain. Respiratory: Denies shortness of breath.  Gastrointestinal: No abdominal pain.   Musculoskeletal: Negative for back pain. Skin: Negative for rash.   ____________________________________________   PHYSICAL EXAM:  VITAL SIGNS: ED Triage Vitals  Enc Vitals Group     BP 12/30/18 1211 128/62     Pulse Rate 12/30/18 1211 96     Resp 12/30/18 1211 18     Temp 12/30/18 1211 98.4 F (36.9 C)     Temp Source 12/30/18 1211 Oral     SpO2 12/30/18 1211 100 %     Weight 12/30/18 1213 115 lb (52.2 kg)     Height 12/30/18 1213 5\' 3"  (1.6 m)     Head Circumference --      Peak Flow --      Pain  Score 12/30/18 1213 1     Pain Loc --      Pain Edu? --      Excl. in [place]? --     Constitutional: Alert and oriented. Well appearing and in no acute distress. Eyes: Conjunctivae are normal.  Head: Atraumatic.Mild tenderness to palpation bilateral frontal and mild to moderate tenderness to palpation bilateral maxillary sinuses. No swelling. No erythema.   Ears: no erythema, normal TMs bilaterally.   Nose: nasal congestion   Mouth/Throat: Mucous membranes are moist. Oropharynx non-erythematous.No tonsillar swelling or exudate.  Neck: No stridor.  No cervical spine tenderness to palpation. Hematological/Lymphatic/Immunilogical: No cervical lymphadenopathy. Cardiovascular: Normal rate, regular rhythm. Grossly normal heart sounds.  Good peripheral circulation. Respiratory: Normal respiratory effort.  No retractions. No wheezes, rales or rhonchi. Good air movement.  Occasional dry cough noted in room. Musculoskeletal: Steady gait. Neurologic:  Normal speech and language. No gait instability. Skin:  Skin is warm, dry and intact. No rash noted. Psychiatric: Mood and affect are normal. Speech and behavior are normal.  ___________________________________________   LABS (all labs ordered are listed, but only abnormal results are displayed)  Labs Reviewed - No data to display ____________________________________________   PROCEDURES Procedures   INITIAL IMPRESSION / ASSESSMENT AND PLAN / ED COURSE  Pertinent labs & imaging results that were available during my care of the patient were reviewed by me and considered in my medical decision making (see chart for details).  Well-appearing patient.  No acute distress.  Suspect viral illness, viral upper respiratory infection with viral sinusitis.  Encourage supportive care, discussed over-the-counter plain Mucinex, nasal saline rinses.  Discussed red follow-up and return parameters.  Work note given.  Offered strep swab, patient declined.   Discussed follow up with Primary care physician this week. Discussed follow up and return parameters including no resolution or any worsening concerns. Patient verbalized understanding and agreed to plan.   ____________________________________________   FINAL CLINICAL IMPRESSION(S) / ED DIAGNOSES  Final diagnoses:  Upper respiratory tract infection, unspecified type     ED Discharge Orders    None       Note: This dictation was prepared with Dragon dictation along with smaller phrase technology. Any transcriptional errors that result from this process are unintentional.         Marylene Land, NP 12/30/18 1349

## 2019-03-08 ENCOUNTER — Other Ambulatory Visit: Payer: Self-pay

## 2019-03-08 ENCOUNTER — Ambulatory Visit (INDEPENDENT_AMBULATORY_CARE_PROVIDER_SITE_OTHER): Payer: BC Managed Care – PPO | Admitting: Obstetrics and Gynecology

## 2019-03-08 ENCOUNTER — Encounter: Payer: Self-pay | Admitting: Obstetrics and Gynecology

## 2019-03-08 VITALS — BP 145/70 | HR 77 | Ht 63.0 in | Wt 117.1 lb

## 2019-03-08 DIAGNOSIS — N903 Dysplasia of vulva, unspecified: Secondary | ICD-10-CM | POA: Diagnosis not present

## 2019-03-08 NOTE — Progress Notes (Signed)
Pt is present today for colposcopy. Pt stated that she was doing well no problems.

## 2019-03-08 NOTE — Progress Notes (Signed)
Colposcopy Procedure Note  Indications: History of VIN II-III.  Previously a patient of Dr .Enzo Bi who has retired. Last colposcopy visit was 08/2018.   Last pap smear: 05/2017, negative  Vulvar pathology history: 12/04/2015 posterior fourchette, right side, 5 mm grayish white lesion, 5 mm Pearline Cables mole Posterior fourchette , inferior-VIN-I Posterior fourchette, superior-VIN-I 03/06/2016 colposcopy, vulvar-normal (no biopsies) 09/04/2016 colposcopy, vulvar-idffuse white epithelium/papillation affect right labia majora, partially on left labia majora, bilateral perianal region, and faint acetowhite epithelium on labia minora bilaterally Perianal vulvar, right-benign skin tissue with hyperkeratosis parakeratosis and chronic inflammation Labia majora, rightVIN-I/flat condyloma/HPV effect 12/10/2016 colposcopy, vulvar-faint acetowhite epithelium at the posterior fourchette only (see geographic mapping diagram 07/06/2017 colposcopy, normal; no biopsies 02/09/2018 colposcopy,labia majoranotable for acetowhite epithelium in several punctate areas;rightlabia minora, and posterior fourchette has normal appearance;perianal region notable for multiple acetowhite punctate areas on right side. Right labia majora biopsy-benign Right perianal biopsy-VIN 1 08/25/2018 colposcopy, vulvar with biopsy: Findings: Right labia majora with semi-lunar area of acetowhite papillation/tufting extending towards the posterior fourchette.  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  Acetic acid solution applied to the vulvar region with cotton swabs and allowed to dry for approximately 2 minutes. Colposcopy of the vulva was performed  with no aceto-white lesions appreciated.  No biopsies were performed.  Findings: Cervix: Not examined.  Vaginal inspection: vaginal colposcopy not performed. Vulvar colposcopy: acetic acid applied o vulvar tissue and normal mucosa without lesions.  Specimens: None  Complications: none.  Plan: Patient with last findings of VIN I (verrucous wart) removed, no evidence of lesions on today's exam.  Will follow up again with vagina exam during annual physical due in September.  Will follow up with Colposcopy in 1 year. If exam remains stable, can discontinue colposcopies and follow up yearly with vaginal exams.    Rubie Maid, MD [facility]

## 2019-06-21 ENCOUNTER — Encounter: Payer: BC Managed Care – PPO | Admitting: Obstetrics and Gynecology

## 2019-07-12 ENCOUNTER — Encounter: Payer: BC Managed Care – PPO | Admitting: Obstetrics and Gynecology

## 2019-07-25 ENCOUNTER — Telehealth: Payer: Self-pay

## 2019-07-25 NOTE — Telephone Encounter (Signed)
Copied from [place] [id number]. Topic: General - Other >> Jul 25, 2019  5:03 PM Antonieta Iba C wrote: Reason for CRM: pt is calling in to reschedule her cpe apt.

## 2019-07-26 NOTE — Telephone Encounter (Signed)
I left a message on patient's voice mail to call back and reschedule physical.

## 2019-07-28 ENCOUNTER — Encounter: Payer: BC Managed Care – PPO | Admitting: Internal Medicine

## 2019-09-19 ENCOUNTER — Telehealth: Payer: Self-pay | Admitting: *Deleted

## 2019-09-19 NOTE — Telephone Encounter (Signed)
Spoke to pt. Will check on her again on Wednesday.

## 2019-09-19 NOTE — Telephone Encounter (Signed)
Patient called stating that she and her husband Shanon Brow tested positive for covid last week. Patient stated that her husband Shanon Brow is dong okay and only ran a fever for a day.  Patient stated that she has been running a fever since last Tuesday. Patient stated that the highest her fever has gotten is 101.0, but primarily staying around 100.0. Patient stated that she has a slight cough and congestion, but her main concern is her fever. Patient stated that she is taking tylenol every 8 hours. Patient stated that she is drinking plenty of fluids and resting. Patient stated that she just wants to know if she should be doing something else about her fever?  ER precautions given to patient and she verbalized understanding.

## 2019-09-19 NOTE — Telephone Encounter (Signed)
No need for concern about the fever alone----only if she was having significant respiratory symptoms, severe diarrhea or wasn't able to eat. Please check on her later also

## 2019-09-20 ENCOUNTER — Telehealth: Payer: Self-pay

## 2019-09-20 MED ORDER — LEVOTHYROXINE SODIUM 50 MCG PO TABS: ORAL_TABLET | ORAL | 0 refills | Status: DC

## 2019-09-20 NOTE — Telephone Encounter (Signed)
Patient was diagnosed with COVID last Tuesday, and she states that she is experiencing extreme nausea. Patient states that she is taking Zofran, but it is not effective. She is wondering if you could send in some Phenergan, or any other medication that you think would work better for her.  She would like this sent to [facility] in [place]. Thanks!

## 2019-09-20 NOTE — Telephone Encounter (Signed)
Patient is calling to follow up on medication request She would like a call once this has been sent to her pharmacy

## 2019-09-21 MED ORDER — PROMETHAZINE HCL 25 MG PO TABS: 25.0000 mg | ORAL_TABLET | Freq: Three times a day (TID) | ORAL | 0 refills | Status: DC | PRN

## 2019-09-21 NOTE — Telephone Encounter (Signed)
Please let her know that I sent the Rx Hope she feels better quickly.

## 2019-09-21 NOTE — Addendum Note (Signed)
Addended by: Viviana Simpler I on: 09/21/2019 07:22 AM   Modules accepted: Orders

## 2019-09-28 ENCOUNTER — Encounter: Payer: BC Managed Care – PPO | Admitting: Internal Medicine

## 2019-09-28 NOTE — Telephone Encounter (Signed)
Spoke to pt. She said she was feeling some better. She had chest x-ray and labs. Fever was gone for almost a week. Temp has gone up today to 99.5 Headaches are clearing up.

## 2019-10-06 ENCOUNTER — Encounter

## 2019-10-06 ENCOUNTER — Ambulatory Visit (INDEPENDENT_AMBULATORY_CARE_PROVIDER_SITE_OTHER): Payer: BC Managed Care – PPO | Admitting: Obstetrics and Gynecology

## 2019-10-06 ENCOUNTER — Other Ambulatory Visit: Payer: Self-pay

## 2019-10-06 ENCOUNTER — Other Ambulatory Visit (HOSPITAL_COMMUNITY)
Admission: RE | Admit: 2019-10-06 | Discharge: 2019-10-06 | Disposition: A | Discharge status: Home / Self Care | Payer: BC Managed Care – PPO | Source: Ambulatory Visit | Attending: Obstetrics and Gynecology | Admitting: Obstetrics and Gynecology

## 2019-10-06 ENCOUNTER — Encounter: Payer: Self-pay | Admitting: Obstetrics and Gynecology

## 2019-10-06 VITALS — BP 114/67 | HR 91 | Ht 63.0 in | Wt 114.6 lb

## 2019-10-06 DIAGNOSIS — Z8619 Personal history of other infectious and parasitic diseases: Secondary | ICD-10-CM

## 2019-10-06 DIAGNOSIS — Z01419 Encounter for gynecological examination (general) (routine) without abnormal findings: Secondary | ICD-10-CM | POA: Diagnosis not present

## 2019-10-06 DIAGNOSIS — Z8616 Personal history of COVID-19: Secondary | ICD-10-CM | POA: Insufficient documentation

## 2019-10-06 DIAGNOSIS — N951 Menopausal and female climacteric states: Secondary | ICD-10-CM

## 2019-10-06 DIAGNOSIS — Z1231 Encounter for screening mammogram for malignant neoplasm of breast: Secondary | ICD-10-CM | POA: Diagnosis not present

## 2019-10-06 DIAGNOSIS — M810 Age-related osteoporosis without current pathological fracture: Secondary | ICD-10-CM

## 2019-10-06 DIAGNOSIS — N952 Postmenopausal atrophic vaginitis: Secondary | ICD-10-CM

## 2019-10-06 DIAGNOSIS — N903 Dysplasia of vulva, unspecified: Secondary | ICD-10-CM

## 2019-10-06 NOTE — Progress Notes (Signed)
ANNUAL PREVENTATIVE CARE GYNECOLOGY  ENCOUNTER NOTE  Subjective:       Amy Riley is a 53 y.o. G0P0 married female here for a routine annual gynecologic exam. She was previously a patient of Dr. Enzo Bi who has retired.  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 The patient is not sexually active. The patient has never take hormone replacement therapy. Patient denies post-menopausal vaginal bleeding. The patient wears seatbelts: yes. The patient participates in regular exercise: no. Has the patient ever been transfused or tattooed?: no.   Current complaints/concerns: 1. Patient notes she was diagnosed with COVID in November (as well as her husband) during the week of Thanksgiving. Overall feeling better, still noting some mild residual symptoms (mostly fatigue and elevated heart rate).    Gynecologic History No LMP recorded (lmp unknown). Patient is postmenopausal. Contraception: post menopausal status Last Pap: 06/10/2017. Results were: normal Last mammogram: 11/16/2018. Results were: normal Last Colonoscopy: 05/18/2015.  Results were: normal Last Dexa Scan: 11/16/2018.  Osteoporosis present.    Obstetric History OB History  Gravida Para Term Preterm AB Living  0 0 0 0 0 0  SAB TAB Ectopic Multiple Live Births  0 0 0 0      Past Medical History:  Diagnosis Date  . Broken foot   . Cancer ([place])    basal cell  . GERD (gastroesophageal reflux disease)   . Hypothyroidism     Family History  Problem Relation Age of Onset  . Heart disease Father        atrial fib  . Cancer Father        colon cancer  . Hypertension Father   . Stroke Father   . Heart disease Maternal Uncle        CAD  . Cancer Paternal Aunt        colon cancer  . Asthma Neg Hx   . Diabetes Neg Hx   . Ovarian cancer Neg Hx     Past Surgical History:  Procedure Laterality Date  . CHOLECYSTECTOMY, LAPAROSCOPIC  12/16   [facility]  . NASAL SEPTOPLASTY W/  TURBINOPLASTY  06/1986   reduction  . SHOULDER ARTHROSCOPY Right 03/2017  . SKIN GRAFT  11/2002   Nasal (for epistaxis)    Social History   Socioeconomic History  . Marital status: Married    Spouse name: Not on file  . Number of children: 0  . Years of education: Not on file  . Highest education level: Not on file  Occupational History  . 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 Smoking status: Never Smoker  . Smokeless tobacco: Never Used  Substance and Sexual Activity  . Alcohol use: No    Alcohol/week: 0.0 standard drinks  . Drug use: No  . Sexual activity: Not Currently  Other Topics Concern  . Not on file  Social History Narrative   Regular exercise Yes   Social Determinants of Health   Financial Resource Strain:   . Difficulty of Paying Living Expenses: Not on file  Food Insecurity:   . Worried About Charity fundraiser in the Last Year: Not on file  . Ran Out of Food in the Last Year: Not on file  Transportation Needs:   . Lack of Transportation (Medical): Not on file  . Lack of Transportation (Non-Medical): Not on file  Physical Activity:   . Days of Exercise per Week:  Not on file  . Minutes of Exercise per Session: Not on file  Stress:   . Feeling of Stress : Not on file  Social Connections:   . Frequency of Communication with Friends and Family: Not on file  . Frequency of Social Gatherings with Friends and Family: Not on file  . Attends Religious Services: Not on file  . Active Member of Clubs or Organizations: Not on file  . Attends Archivist Meetings: Not on file  . Marital Status: Not on file  Intimate Partner Violence:   . Fear of Current or Ex-Partner: Not on file  . Emotionally Abused: Not on file  . Physically Abused: Not on file  . Sexually Abused: Not on file    Current Outpatient Medications on File Prior to Visit  Medication Sig Dispense Refill  . levothyroxine (SYNTHROID) 50  MCG tablet TAKE 1 TABLET BY MOUTH ONCE DAILY ON AN EMPTY STOMACH. WAIT 30 MINUTES BEFORE TAKING OTHER MEDS. 90 tablet 0   No current facility-administered medications on file prior to visit.    Allergies  Allergen Reactions  . Acidophilus Other (See Comments)    Rectal spasms  . Aspirin     REACTION: nose bleeds  . Cefdinir Other (See Comments)  . Cephalosporins Other (See Comments)  . Nsaids Other (See Comments)    bleeding bleeding  . Tolmetin     Other reaction(s): Other (See Comments) bleeding      Review of Systems ROS Review of Systems - General ROS: negative for - chills, fever, hot flashes, night sweats, weight gain or weight loss.  Psychological ROS: negative for - anxiety, decreased libido, depression, mood swings, physical abuse or sexual abuse Ophthalmic ROS: negative for - blurry vision, eye pain or loss of vision ENT ROS: negative for - headaches, hearing change, visual changes or vocal changes Allergy and Immunology ROS: negative for - hives, itchy/watery eyes or seasonal allergies Hematological and Lymphatic ROS: negative for - bleeding problems, bruising, swollen lymph nodes or weight loss Endocrine ROS: negative for - galactorrhea, hair pattern changes, hot flashes, malaise/lethargy, mood swings, palpitations, polydipsia/polyuria, skin changes, temperature intolerance or unexpected weight changes Breast ROS: negative for - new or changing breast lumps or nipple discharge Respiratory ROS: negative for - cough or shortness of breath Cardiovascular ROS: negative for - chest pain, irregular heartbeat, palpitations or shortness of breath Gastrointestinal ROS: no abdominal pain, change in bowel habits, or black or bloody stools Genito-Urinary ROS: no dysuria, trouble voiding, or hematuria Musculoskeletal ROS: negative for - joint pain or joint stiffness Neurological ROS: negative for - bowel and bladder control changes Dermatological ROS: negative for rash and skin  lesion changes   Objective:   BP 114/67   Pulse 91   Ht 5\' 3"  (1.6 m)   Wt 114 lb 9.6 oz (52 kg)   LMP  (LMP Unknown)   BMI 20.30 kg/m  CONSTITUTIONAL: Well-developed, well-nourished female in no acute distress.  PSYCHIATRIC: Normal mood and affect. Normal behavior. Normal judgment and thought content. [place]: Alert and oriented to person, place, and time. Normal muscle tone coordination. No cranial nerve deficit noted. HENT:  Normocephalic, atraumatic, External right and left ear normal. Oropharynx is clear and moist EYES: Conjunctivae and EOM are normal. Pupils are equal, round, and reactive to light. No scleral icterus.  NECK: Normal range of motion, supple, no masses.  Normal thyroid.  SKIN: Skin is warm and dry. No rash noted. Not diaphoretic. No erythema. No pallor. CARDIOVASCULAR: Normal  heart rate noted, regular rhythm, no murmur. RESPIRATORY: Clear to auscultation bilaterally. Effort and breath sounds normal, no problems with respiration noted. BREASTS: Symmetric in size. No masses, skin changes, nipple drainage, or lymphadenopathy. ABDOMEN: Soft, normal bowel sounds, no distention noted.  No tenderness, rebound or guarding.  BLADDER: Normal PELVIC:  Bladder no bladder distension noted  Urethra: normal appearing urethra with no masses, tenderness or lesions  Vulva: normal appearing vulva with no masses, tenderness or lesions  Vagina: mildly atrophic, no lesions, no discharge.  Used pediatric speculum due to h/o discomfort.   Cervix: normal appearing cervix without discharge or lesions  Uterus: uterus is normal size, shape, consistency and nontender  Adnexa: normal adnexa in size, nontender and no masses  RV: External Exam NormaI, No Rectal Masses and Normal Sphincter tone  MUSCULOSKELETAL: Normal range of motion. No tenderness.  No cyanosis, clubbing, or edema.  2+ distal pulses. LYMPHATIC: No Axillary, Supraclavicular, or Inguinal Adenopathy.   Labs: Labs reviewed  from [facility], 09/23/2019.   Assessment:   Encounter for well woman exam with routine gynecological exam Breast cancer screening by mammogram History of 2019 novel coronavirus disease (COVID-19) - Diagnosed November 23rd. Osteoporosis, post-menopausal Vaginal atrophy Menopausal state VIN  Plan:  Pap: Not done, up to date.  Next due in 1 year.  Mammogram: Ordered, due in 1 month.  Stool Guaiac Testing:  Not Indicated. Patient up to date with colonoscopy.  Labs: None ordered.  Had labs last month.  reviewed in [employer].  Liver enzymes were mildly elevated, however patient reports that she had been taking Tylenol regularly during her COVID infection as she had a fever for almost 10 days.  Routine preventative health maintenance measures emphasized: Exercise/Diet/Weight control, Tobacco Warnings, Alcohol/Substance use risks, Stress Management and Peer Pressure Issues Osteoporosis, unable to take Calcium due to intolerance. Need to consider other medications for osteoporosis.  H/o VIN, stable, no new lesions. Last colposcopy 6 months ago was normal. Can continue yearly surveillance.  Return to [place], MD [facility]

## 2019-10-06 NOTE — Progress Notes (Signed)
Pt is present for annual exam. Pt had flu vaccine in October 2020. Pt stated that she was doing well.

## 2019-10-06 NOTE — Patient Instructions (Addendum)
Health Maintenance for Postmenopausal Women Menopause is a normal process in which your ability to get pregnant comes to an end. This process happens slowly over many months or years, usually between the ages of 48 and 55. Menopause is complete when you have missed your menstrual periods for 12 months. It is important to talk with your health care provider about some of the most common conditions that affect women after menopause (postmenopausal women). These include heart disease, cancer, and bone loss (osteoporosis). Adopting a healthy lifestyle and getting preventive care can help to promote your health and wellness. The actions you take can also lower your chances of developing some of these common conditions. What should I know about menopause? During menopause, you may get a number of symptoms, such as:  Hot flashes. These can be moderate or severe.  Night sweats.  Decrease in sex drive.  Mood swings.  Headaches.  Tiredness.  Irritability.  Memory problems.  Insomnia. Choosing to treat or not to treat these symptoms is a decision that you make with your health care provider. Do I need hormone replacement therapy?  Hormone replacement therapy is effective in treating symptoms that are caused by menopause, such as hot flashes and night sweats.  Hormone replacement carries certain risks, especially as you become older. If you are thinking about using estrogen or estrogen with progestin, discuss the benefits and risks with your health care provider. What is my risk for heart disease and stroke? The risk of heart disease, heart attack, and stroke increases as you age. One of the causes may be a change in the body's hormones during menopause. This can affect how your body uses dietary fats, triglycerides, and cholesterol. Heart attack and stroke are medical emergencies. There are many things that you can do to help prevent heart disease and stroke. Watch your blood pressure  High  blood pressure causes heart disease and increases the risk of stroke. This is more likely to develop in people who have high blood pressure readings, are of African descent, or are overweight.  Have your blood pressure checked: ? Every 3-5 years if you are 18-39 years of age. ? Every year if you are 40 years old or older. Eat a healthy diet   Eat a diet that includes plenty of vegetables, fruits, low-fat dairy products, and lean protein.  Do not eat a lot of foods that are high in solid fats, added sugars, or sodium. Get regular exercise Get regular exercise. This is one of the most important things you can do for your health. Most adults should:  Try to exercise for at least 150 minutes each week. The exercise should increase your heart rate and make you sweat (moderate-intensity exercise).  Try to do strengthening exercises at least twice each week. Do these in addition to the moderate-intensity exercise.  Spend less time sitting. Even light physical activity can be beneficial. Other tips  Work with your health care provider to achieve or maintain a healthy weight.  Do not use any products that contain nicotine or tobacco, such as cigarettes, e-cigarettes, and chewing tobacco. If you need help quitting, ask your health care provider.  Know your numbers. Ask your health care provider to check your cholesterol and your blood sugar (glucose). Continue to have your blood tested as directed by your health care provider. Do I need screening for cancer? Depending on your health history and family history, you may need to have cancer screening at different stages of your life. This   may include screening for:  Breast cancer.  Cervical cancer.  Lung cancer.  Colorectal cancer. What is my risk for osteoporosis? After menopause, you may be at increased risk for osteoporosis. Osteoporosis is a condition in which bone destruction happens more quickly than new bone creation. To help prevent  osteoporosis or the bone fractures that can happen because of osteoporosis, you may take the following actions:  If you are 19-50 years old, get at least 1,000 mg of calcium and at least 600 mg of vitamin D per day.  If you are older than age 50 but younger than age 70, get at least 1,200 mg of calcium and at least 600 mg of vitamin D per day.  If you are older than age 70, get at least 1,200 mg of calcium and at least 800 mg of vitamin D per day. Smoking and drinking excessive alcohol increase the risk of osteoporosis. Eat foods that are rich in calcium and vitamin D, and do weight-bearing exercises several times each week as directed by your health care provider. How does menopause affect my mental health? Depression may occur at any age, but it is more common as you become older. Common symptoms of depression include:  Low or sad mood.  Changes in sleep patterns.  Changes in appetite or eating patterns.  Feeling an overall lack of motivation or enjoyment of activities that you previously enjoyed.  Frequent crying spells. Talk with your health care provider if you think that you are experiencing depression. General instructions See your health care provider for regular wellness exams and vaccines. This may include:  Scheduling regular health, dental, and eye exams.  Getting and maintaining your vaccines. These include: ? Influenza vaccine. Get this vaccine each year before the flu season begins. ? Pneumonia vaccine. ? Shingles vaccine. ? Tetanus, diphtheria, and pertussis (Tdap) booster vaccine. Your health care provider may also recommend other immunizations. Tell your health care provider if you have ever been abused or do not feel safe at home. Summary  Menopause is a normal process in which your ability to get pregnant comes to an end.  This condition causes hot flashes, night sweats, decreased interest in sex, mood swings, headaches, or lack of sleep.  Treatment for this  condition may include hormone replacement therapy.  Take actions to keep yourself healthy, including exercising regularly, eating a healthy diet, watching your weight, and checking your blood pressure and blood sugar levels.  Get screened for cancer and depression. Make sure that you are up to date with all your vaccines. This information is not intended to replace advice given to you by your health care provider. Make sure you discuss any questions you have with your health care provider. Document Released: 11/28/2005 Document Revised: 09/29/2018 Document Reviewed: 09/29/2018 Elsevier Patient Education  2020 Elsevier Inc.   Breast Self-Awareness Breast self-awareness is knowing how your breasts look and feel. Doing breast self-awareness is important. It allows you to catch a breast problem early while it is still small and can be treated. All women should do breast self-awareness, including women who have had breast implants. Tell your doctor if you notice a change in your breasts. What you need:  A mirror.  A well-lit room. How to do a breast self-exam A breast self-exam is one way to learn what is normal for your breasts and to check for changes. To do a breast self-exam: Look for changes  1. Take off all the clothes above your waist. 2. Stand in   front of a mirror in a room with good lighting. 3. Put your hands on your hips. 4. Push your hands down. 5. Look at your breasts and nipples in the mirror to see if one breast or nipple looks different from the other. Check to see if: ? The shape of one breast is different. ? The size of one breast is different. ? There are wrinkles, dips, and bumps in one breast and not the other. 6. Look at each breast for changes in the skin, such as: ? Redness. ? Scaly areas. 7. Look for changes in your nipples, such as: ? Liquid around the nipples. ? Bleeding. ? Dimpling. ? Redness. ? A change in where the nipples are. Feel for  changes  1. Lie on your back on the floor. 2. Feel each breast. To do this, follow these steps: ? Pick a breast to feel. ? Put the arm closest to that breast above your head. ? Use your other arm to feel the nipple area of your breast. Feel the area with the pads of your three middle fingers by making small circles with your fingers. For the first circle, press lightly. For the second circle, press harder. For the third circle, press even harder. ? Keep making circles with your fingers at the different pressures as you move down your breast. Stop when you feel your ribs. ? Move your fingers a little toward the center of your body. ? Start making circles with your fingers again, this time going up until you reach your collarbone. ? Keep making up-and-down circles until you reach your armpit. Remember to keep using the three pressures. ? Feel the other breast in the same way. 3. Sit or stand in the tub or shower. 4. With soapy water on your skin, feel each breast the same way you did in step 2 when you were lying on the floor. Write down what you find Writing down what you find can help you remember what to tell your doctor. Write down:  What is normal for each breast.  Any changes you find in each breast, including: ? The kind of changes you find. ? Whether you have pain. ? Size and location of any lumps.  When you last had your menstrual period. General tips  Check your breasts every month.  If you are breastfeeding, the best time to check your breasts is after you feed your baby or after you use a breast pump.  If you get menstrual periods, the best time to check your breasts is 5-7 days after your menstrual period is over.  With time, you will become comfortable with the self-exam, and you will begin to know if there are changes in your breasts. Contact a doctor if you:  See a change in the shape or size of your breasts or nipples.  See a change in the skin of your breast or  nipples, such as red or scaly skin.  Have fluid coming from your nipples that is not normal.  Find a lump or thick area that was not there before.  Have pain in your breasts.  Have any concerns about your breast health. Summary  Breast self-awareness includes looking for changes in your breasts, as well as feeling for changes within your breasts.  Breast self-awareness should be done in front of a mirror in a well-lit room.  You should check your breasts every month. If you get menstrual periods, the best time to check your breasts is 5-7 days   after your menstrual period is over.  Let your doctor know of any changes you see in your breasts, including changes in size, changes on the skin, pain or tenderness, or fluid from your nipples that is not normal. This information is not intended to replace advice given to you by your health care provider. Make sure you discuss any questions you have with your health care provider. Document Released: 03/24/2008 Document Revised: 05/25/2018 Document Reviewed: 05/25/2018 Elsevier Patient Education  2020 Elsevier Inc.  

## 2019-10-07 ENCOUNTER — Encounter: Payer: BC Managed Care – PPO | Admitting: Obstetrics and Gynecology

## 2019-10-12 ENCOUNTER — Encounter: Payer: Self-pay | Admitting: Internal Medicine

## 2019-10-12 HISTORY — PX: DORSAL COMPARTMENT RELEASE: SHX1474

## 2019-10-18 LAB — CYTOLOGY - PAP
Comment: NEGATIVE
High risk HPV: NEGATIVE

## 2019-10-18 NOTE — Progress Notes (Signed)
Please inform patient of abnormal pap smear, needs to schedule an appointment for f/u.

## 2019-10-27 ENCOUNTER — Encounter: Payer: BC Managed Care – PPO | Admitting: Obstetrics and Gynecology

## 2019-11-23 ENCOUNTER — Ambulatory Visit
Admission: RE | Admit: 2019-11-23 | Discharge: 2019-11-23 | Disposition: A | Discharge status: Home / Self Care | Payer: BC Managed Care – PPO | Source: Ambulatory Visit | Attending: Obstetrics and Gynecology | Admitting: Obstetrics and Gynecology

## 2019-11-23 DIAGNOSIS — Z1231 Encounter for screening mammogram for malignant neoplasm of breast: Secondary | ICD-10-CM | POA: Diagnosis not present

## 2019-11-29 ENCOUNTER — Ambulatory Visit (INDEPENDENT_AMBULATORY_CARE_PROVIDER_SITE_OTHER): Payer: BC Managed Care – PPO | Admitting: Internal Medicine

## 2019-11-29 ENCOUNTER — Encounter: Payer: Self-pay | Admitting: Internal Medicine

## 2019-11-29 ENCOUNTER — Other Ambulatory Visit: Payer: Self-pay

## 2019-11-29 VITALS — BP 94/62 | HR 73 | Temp 97.4°F | Ht 63.0 in | Wt 119.0 lb

## 2019-11-29 DIAGNOSIS — Z0000 Encounter for general adult medical examination without abnormal findings: Secondary | ICD-10-CM | POA: Diagnosis not present

## 2019-11-29 DIAGNOSIS — E039 Hypothyroidism, unspecified: Secondary | ICD-10-CM | POA: Diagnosis not present

## 2019-11-29 DIAGNOSIS — K219 Gastro-esophageal reflux disease without esophagitis: Secondary | ICD-10-CM

## 2019-11-29 LAB — HEPATIC FUNCTION PANEL
ALT: 14 U/L (ref 0–35)
AST: 20 U/L (ref 0–37)
Albumin: 4.2 g/dL (ref 3.5–5.2)
Alkaline Phosphatase: 73 U/L (ref 39–117)
Bilirubin, Direct: 0.1 mg/dL (ref 0.0–0.3)
Total Bilirubin: 0.7 mg/dL (ref 0.2–1.2)
Total Protein: 7.2 g/dL (ref 6.0–8.3)

## 2019-11-29 LAB — TSH: TSH: 9.4 u[IU]/mL — ABNORMAL HIGH (ref 0.35–4.50)

## 2019-11-29 LAB — T4, FREE: Free T4: 0.66 ng/dL (ref 0.60–1.60)

## 2019-11-29 NOTE — Assessment & Plan Note (Signed)
Seem to be euthyroid Will check labs

## 2019-11-29 NOTE — Assessment & Plan Note (Signed)
Healthy but lots of stress Discussed sleep, exercise Recommended COVID vaccine despite having had it Yearly flu vaccine Following about mildly abnormal pap Yearly mammogram--just had Colon due 2026

## 2019-11-29 NOTE — Progress Notes (Signed)
   Subjective:    Patient ID: Amy Riley, female    DOB: October 08, 1966, 54 y.o.   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 Negative for unexpected weight change.       Has not been exercising  HENT: Positive for tinnitus. Negative for hearing loss and trouble swallowing.        Still using Invisalign Keeps up with dentist  Eyes:       No diplopia or unilateral vision loss Blurry vision at times  Respiratory: Negative for cough and shortness of breath.   Cardiovascular: Positive for palpitations. Negative for leg swelling.       Gets some central chest pressure---relates to GI. Prilosec helps  Gastrointestinal: Negative for blood in stool and constipation.       Some heartburn---prn prilosec  Endocrine: Negative for polydipsia and polyuria.  Genitourinary: Negative for dyspareunia, dysuria and hematuria.  Musculoskeletal: Positive for back pain. Negative for arthralgias and joint swelling.       Has had some recent back strains  Skin: Negative for rash.       No suspicious skin lesions  Allergic/Immunologic: Positive for environmental allergies. Negative for immunocompromised state.       Doesn't use meds generally  Neurological: Negative for dizziness, syncope, light-headedness and headaches.  Hematological: Negative for adenopathy. Bruises/bleeds easily.  Psychiatric/Behavioral: Negative for dysphoric mood. The patient is not nervous/anxious.        Uses 1 PM at night Lots of stress       Objective:   Physical Exam  Constitutional: She is oriented to person, place, and time. She appears well-developed. No distress.  HENT:  Head: Normocephalic and atraumatic.    Right Ear: External ear normal.  Left Ear: External ear normal.  Mouth/Throat: Oropharynx is clear and moist. No oropharyngeal exudate.  Eyes: Pupils are equal, round, and reactive to light. Conjunctivae are normal.  Neck: No thyromegaly present.  Cardiovascular: Normal rate, regular rhythm and normal heart sounds. Exam reveals no gallop.  No murmur heard. Faint pedal pulses  Respiratory: Effort normal and breath sounds normal. No respiratory distress. She has no wheezes. She has no rales.  GI: Soft. There is no abdominal tenderness.  Musculoskeletal:        General: No tenderness or edema.  Lymphadenopathy:    She has no cervical adenopathy.  Neurological: She is alert and oriented to person, place, and time.  Skin: No rash noted. No erythema.  Psychiatric: She has a normal mood and affect. Her behavior is normal.           Assessment & Plan:

## 2019-11-29 NOTE — Assessment & Plan Note (Signed)
Uses PPI prn

## 2020-01-10 ENCOUNTER — Encounter: Payer: Self-pay | Admitting: Obstetrics and Gynecology

## 2020-01-10 ENCOUNTER — Ambulatory Visit (INDEPENDENT_AMBULATORY_CARE_PROVIDER_SITE_OTHER): Payer: BC Managed Care – PPO | Admitting: Obstetrics and Gynecology

## 2020-01-10 ENCOUNTER — Other Ambulatory Visit: Payer: Self-pay

## 2020-01-10 ENCOUNTER — Other Ambulatory Visit (HOSPITAL_COMMUNITY)
Admission: RE | Admit: 2020-01-10 | Discharge: 2020-01-10 | Disposition: A | Discharge status: Home / Self Care | Payer: BC Managed Care – PPO | Source: Ambulatory Visit | Attending: Obstetrics and Gynecology | Admitting: Obstetrics and Gynecology

## 2020-01-10 VITALS — BP 107/72 | HR 75 | Ht 63.0 in | Wt 117.0 lb

## 2020-01-10 DIAGNOSIS — N903 Dysplasia of vulva, unspecified: Secondary | ICD-10-CM | POA: Diagnosis not present

## 2020-01-10 DIAGNOSIS — N882 Stricture and stenosis of cervix uteri: Secondary | ICD-10-CM

## 2020-01-10 DIAGNOSIS — N952 Postmenopausal atrophic vaginitis: Secondary | ICD-10-CM | POA: Diagnosis present

## 2020-01-10 DIAGNOSIS — R87619 Unspecified abnormal cytological findings in specimens from cervix uteri: Secondary | ICD-10-CM | POA: Diagnosis not present

## 2020-01-10 NOTE — Progress Notes (Signed)
Pt present for colpo. Pt stated that she was doing well no problems.  

## 2020-01-10 NOTE — Progress Notes (Signed)
    GYNECOLOGY CLINIC COLPOSCOPY PROCEDURE NOTE  54 y.o. 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 Discussed role for HPV in cervical dysplasia, need for surveillance.  Patient given informed consent, signed copy in the chart, time out was performed.  Placed in lithotomy position. Vulva and cervix viewed with speculum and colposcope after application of acetic acid.   Colposcopy adequate? Yes Findings: Cervix: no visible lesions, no mosaicism, no punctation and no abnormal vasculature; atrophic. Unable to identify transformation zone due to cervical stenosis. ECC performed.  Vaginal inspection: vaginal colposcopy not performed. Vaginal inspection was normal except vaginal atrophy present.  Vulvar colposcopy: acetic acid applied o vulvar tissue and normal mucosa without lesions.  Endometrial biopsy performed due to patient with AGUS pap smear, cervical dilation was required due to cervical stenosis. Tenaculum was used for uterine stabilizatio. Uterus sounded to 5.5 cm. Scant tissue retrieved.   All specimens were labeled and sent to pathology.  Patient tolerated the procedure well. She was given post procedure instructions.  Will follow up pathology and manage accordingly; patient will be contacted with results and recommendations.  Routine preventative health maintenance measures emphasized.   Rubie Maid, MD [facility]

## 2020-01-10 NOTE — Addendum Note (Signed)
Addended by: Edwyna Shell on: 01/10/2020 11:31 AM   Modules accepted: Orders

## 2020-01-10 NOTE — Patient Instructions (Signed)
Colposcopy, Care After This sheet gives you information about how to care for yourself after your procedure. Your health care provider may also give you more specific instructions. If you have problems or questions, contact your health care provider. What can I expect after the procedure? If you had a colposcopy without a biopsy, you can expect to feel fine right away, but you may have some spotting for a few days. You can go back to your normal activities. If you had a colposcopy with a biopsy, it is common to have:  Soreness and pain. This may last for a few days.  Light-headedness.  Mild vaginal bleeding or dark-colored, grainy discharge. This may last for a few days. The discharge may be due to a solution that was used during the procedure. You may need to wear a sanitary pad during this time.  Spotting for at least 48 hours after the procedure. Follow these instructions at home:   Take over-the-counter and prescription medicines only as told by your health care provider. Talk with your health care provider about what type of over-the-counter pain medicine and prescription medicine you can start taking again. It is especially important to talk with your health care provider if you take blood-thinning medicine.  Do not drive or use heavy machinery while taking prescription pain medicine.  For at least 3 days after your procedure, or as long as told by your health care provider, avoid: ? Douching. ? Using tampons. ? Having sexual intercourse.  Continue to use birth control (contraception).  Limit your physical activity for the first day after the procedure as told by your health care provider. Ask your health care provider what activities are safe for you.  It is up to you to get the results of your procedure. Ask your health care provider, or the department performing the procedure, when your results will be ready.  Keep all follow-up visits as told by your health care provider.  This is important. Contact a health care provider if:  You develop a skin rash. Get help right away if:  You are bleeding heavily from your vagina or you are passing blood clots. This includes using more than one sanitary pad per hour for 2 hours in a row.  You have a fever or chills.  You have pelvic pain.  You have abnormal, yellow-colored, or bad-smelling vaginal discharge. This could be a sign of infection.  You have severe pain or cramps in your lower abdomen that do not get better with medicine.  You feel light-headed or dizzy, or you faint. Summary  If you had a colposcopy without a biopsy, you can expect to feel fine right away, but you may have some spotting for a few days. You can go back to your normal activities.  If you had a colposcopy with a biopsy, you may notice mild pain and spotting for 48 hours after the procedure.  Avoid douching, using tampons, and having sexual intercourse for 3 days after the procedure or as long as told by your health care provider.  Contact your health care provider if you have bleeding, severe pain, or signs of infection. This information is not intended to replace advice given to you by your health care provider. Make sure you discuss any questions you have with your health care provider. Document Revised: 09/18/2017 Document Reviewed: 05/23/2016 Elsevier Patient Education  [place].    Endometrial Biopsy, Care After This sheet gives you information about how to care for yourself after  your procedure. Your health care provider may also give you more specific instructions. If you have problems or questions, contact your health care provider. What can I expect after the procedure? After the procedure, it is common to have:  Mild cramping.  A small amount of vaginal bleeding for a few days. This is normal. Follow these instructions at home:   Take over-the-counter and prescription medicines only as told by your health care  provider.  Do not douche, use tampons, or have sexual intercourse until your health care provider approves.  Return to your normal activities as told by your health care provider. Ask your health care provider what activities are safe for you.  Follow instructions from your health care provider about any activity restrictions, such as restrictions on strenuous exercise or heavy lifting. Contact a health care provider if:  You have heavy bleeding, or bleed for longer than 2 days after the procedure.  You have bad smelling discharge from your vagina.  You have a fever or chills.  You have a burning sensation when urinating or you have difficulty urinating.  You have severe pain in your lower abdomen. Get help right away if:  You have severe cramps in your stomach or back.  You pass large blood clots.  Your bleeding increases.  You become weak or light-headed, or you pass out. Summary  After the procedure, it is common to have mild cramping and a small amount of vaginal bleeding for a few days.  Do not douche, use tampons, or have sexual intercourse until your health care provider approves.  Return to your normal activities as told by your health care provider. Ask your health care provider what activities are safe for you. This information is not intended to replace advice given to you by your health care provider. Make sure you discuss any questions you have with your health care provider. Document Revised: 09/18/2017 Document Reviewed: 10/22/2016 Elsevier Patient Education  [place].

## 2020-01-16 LAB — SURGICAL PATHOLOGY

## 2020-02-02 ENCOUNTER — Other Ambulatory Visit: Payer: Self-pay | Admitting: Internal Medicine

## 2020-03-07 ENCOUNTER — Encounter: Payer: BC Managed Care – PPO | Admitting: Obstetrics and Gynecology

## 2020-04-02 DIAGNOSIS — H9319 Tinnitus, unspecified ear: Secondary | ICD-10-CM

## 2020-07-24 ENCOUNTER — Encounter: Payer: BC Managed Care – PPO | Admitting: Obstetrics and Gynecology

## 2020-10-05 ENCOUNTER — Encounter: Payer: Self-pay | Admitting: Obstetrics and Gynecology

## 2020-10-05 ENCOUNTER — Ambulatory Visit (INDEPENDENT_AMBULATORY_CARE_PROVIDER_SITE_OTHER): Payer: BC Managed Care – PPO | Admitting: Obstetrics and Gynecology

## 2020-10-05 ENCOUNTER — Other Ambulatory Visit: Payer: Self-pay

## 2020-10-05 VITALS — BP 121/81 | HR 86 | Ht 63.0 in | Wt 119.2 lb

## 2020-10-05 DIAGNOSIS — N903 Dysplasia of vulva, unspecified: Secondary | ICD-10-CM | POA: Diagnosis not present

## 2020-10-05 NOTE — Progress Notes (Signed)
Pt present for vulvar colpo. Pt stated that she was doing well.

## 2020-10-05 NOTE — Progress Notes (Signed)
Colposcopy Procedure Note  Indications: History of VIN II-III (dx 2018).  Last colposcopy visit was 01/10/2020 (also had AGUS pap on 10/06/2019 and normal biopsy). Has been undergoing surveillance q 6 months with colposcopies.    Vulvar pathology history: 12/04/2015 posterior fourchette, right side, 5 mm grayish white lesion, 5 mm Gray mole Posterior fourchette , inferior-VIN-I Posterior fourchette, superior-VIN-I 03/06/2016 colposcopy, vulvar-normal (no biopsies) 09/04/2016 colposcopy, vulvar-idffuse white epithelium/papillation affect right labia majora, partially on left labia majora, bilateral perianal region, and faint acetowhite epithelium on labia minora bilaterally Perianal vulvar, right-benign skin tissue with hyperkeratosis parakeratosis and chronic inflammation Labia majora, rightVIN-I/flat condyloma/HPV effect 12/10/2016 colposcopy, vulvar-faint acetowhite epithelium at the posterior fourchette only (see geographic mapping diagram 07/06/2017 colposcopy, normal; no biopsies 02/09/2018 colposcopy,labia majoranotable for acetowhite epithelium in several punctate areas;rightlabia minora, and posterior fourchette has normal appearance;perianal region notable for multiple acetowhite punctate areas on right side. Right labia majora biopsy-benign Right perianal biopsy-VIN 1 08/25/2018 colposcopy, vulvar with biopsy: Findings: Right labia majora with semi-lunar area of acetowhite papillation/tufting extending towards the posterior fourchette.  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  Acetic acid solution applied to the vulvar region with cotton swabs and allowed to dry for approximately 2 minutes.  Colposcopy of the vulva was performed with no aceto-white lesions appreciated.  No biopsies were performed.  Findings: Cervix: Not examined.  Vaginal inspection: vaginal colposcopy not performed. Vulvar colposcopy: acetic acid applied o vulvar tissue and normal mucosa without lesions.  Specimens: None  Complications: none.  Plan: Patient with last findings of VIN I (verrucous wart) removed, no evidence of lesions on today's exam.    Will follow up with Colposcopy in 1 year. If exam remains stable, can discontinue colposcopies and follow up yearly with vaginal exams. Return to clinic for any scheduled appointments or for any gynecologic concerns as needed. Has annual exam next week.     Rubie Maid, MD [facility]

## 2020-10-10 NOTE — Patient Instructions (Addendum)
Preventive Care 40-55 Years Old, Female Preventive care refers to visits with your health care provider and lifestyle choices that can promote health and wellness. This includes:  A yearly physical exam. This may also be called an annual well check.  Regular dental visits and eye exams.  Immunizations.  Screening for certain conditions.  Healthy lifestyle choices, such as eating a healthy diet, getting regular exercise, not using drugs or products that contain nicotine and tobacco, and limiting alcohol use. What can I expect for my preventive care visit? Physical exam Your health care provider will check your:  Height and weight. This may be used to calculate body mass index (BMI), which tells if you are at a healthy weight.  Heart rate and blood pressure.  Skin for abnormal spots. Counseling Your health care provider may ask you questions about your:  Alcohol, tobacco, and drug use.  Emotional well-being.  Home and relationship well-being.  Sexual activity.  Eating habits.  Work and work environment.  Method of birth control.  Menstrual cycle.  Pregnancy history. What immunizations do I need?  Influenza (flu) vaccine  This is recommended every year. Tetanus, diphtheria, and pertussis (Tdap) vaccine  You may need a Td booster every 10 years. Varicella (chickenpox) vaccine  You may need this if you have not been vaccinated. Zoster (shingles) vaccine  You may need this after age 60. Measles, mumps, and rubella (MMR) vaccine  You may need at least one dose of MMR if you were born in 1957 or later. You may also need a second dose. Pneumococcal conjugate (PCV13) vaccine  You may need this if you have certain conditions and were not previously vaccinated. Pneumococcal polysaccharide (PPSV23) vaccine  You may need one or two doses if you smoke cigarettes or if you have certain conditions. Meningococcal conjugate (MenACWY) vaccine  You may need this if you  have certain conditions. Hepatitis A vaccine  You may need this if you have certain conditions or if you travel or work in places where you may be exposed to hepatitis A. Hepatitis B vaccine  You may need this if you have certain conditions or if you travel or work in places where you may be exposed to hepatitis B. Haemophilus influenzae type b (Hib) vaccine  You may need this if you have certain conditions. Human papillomavirus (HPV) vaccine  If recommended by your health care provider, you may need three doses over 6 months. You may receive vaccines as individual doses or as more than one vaccine together in one shot (combination vaccines). Talk with your health care provider about the risks and benefits of combination vaccines. What tests do I need? Blood tests  Lipid and cholesterol levels. These may be checked every 5 years, or more frequently if you are over 50 years old.  Hepatitis C test.  Hepatitis B test. Screening  Lung cancer screening. You may have this screening every year starting at age 55 if you have a 30-pack-year history of smoking and currently smoke or have quit within the past 15 years.  Colorectal cancer screening. All adults should have this screening starting at age 50 and continuing until age 75. Your health care provider may recommend screening at age 45 if you are at increased risk. You will have tests every 1-10 years, depending on your results and the type of screening test.  Diabetes screening. This is done by checking your blood sugar (glucose) after you have not eaten for a while (fasting). You may have this   done every 1-3 years.  Mammogram. This may be done every 1-2 years. Talk with your health care provider about when you should start having regular mammograms. This may depend on whether you have a family history of breast cancer.  BRCA-related cancer screening. This may be done if you have a family history of breast, ovarian, tubal, or peritoneal  cancers.  Pelvic exam and Pap test. This may be done every 3 years starting at age 70. Starting at age 87, this may be done every 5 years if you have a Pap test in combination with an HPV test. Other tests  Sexually transmitted disease (STD) testing.  Bone density scan. This is done to screen for osteoporosis. You may have this scan if you are at high risk for osteoporosis. Follow these instructions at home: Eating and drinking  Eat a diet that includes fresh fruits and vegetables, whole grains, lean protein, and low-fat dairy.  Take vitamin and mineral supplements as recommended by your health care provider.  Do not drink alcohol if: ? Your health care provider tells you not to drink. ? You are pregnant, may be pregnant, or are planning to become pregnant.  If you drink alcohol: ? Limit how much you have to 0-1 drink a day. ? Be aware of how much alcohol is in your drink. In the [place] one drink equals one 12 oz bottle of beer (355 mL), one 5 oz glass of wine (148 mL), or one 1 oz glass of hard liquor (44 mL). Lifestyle  Take daily care of your teeth and gums.  Stay active. Exercise for at least 30 minutes on 5 or more days each week.  Do not use any products that contain nicotine or tobacco, such as cigarettes, e-cigarettes, and chewing tobacco. If you need help quitting, ask your health care provider.  If you are sexually active, practice safe sex. Use a condom or other form of birth control (contraception) in order to prevent pregnancy and STIs (sexually transmitted infections).  If told by your health care provider, take low-dose aspirin daily starting at age 59. What's next?  Visit your health care provider once a year for a well check visit.  Ask your health care provider how often you should have your eyes and teeth checked.  Stay up to date on all vaccines. This information is not intended to replace advice given to you by your health care provider. Make sure you  discuss any questions you have with your health care provider. Document Revised: 06/17/2018 Document Reviewed: 06/17/2018 Elsevier Patient Education  2020 [place] Breast self-awareness is knowing how your breasts look and feel. Doing breast self-awareness is important. It allows you to catch a breast problem early while it is still small and can be treated. All women should do breast self-awareness, including women who have had breast implants. Tell your doctor if you notice a change in your breasts. What you need:  A mirror.  A well-lit room. How to do a breast self-exam A breast self-exam is one way to learn what is normal for your breasts and to check for changes. To do a breast self-exam: Look for changes  1. Take off all the clothes above your waist. 2. Stand in front of a mirror in a room with good lighting. 3. Put your hands on your hips. 4. Push your hands down. 5. Look at your breasts and nipples in the mirror to see if one breast or nipple looks different  other. Check to see if: ? The shape of one breast is different. ? The size of one breast is different. ? There are wrinkles, dips, and bumps in one breast and not the other. 6. Look at each breast for changes in the skin, such as: ? Redness. ? Scaly areas. 7. Look for changes in your nipples, such as: ? Liquid around the nipples. ? Bleeding. ? Dimpling. ? Redness. ? A change in where the nipples are. Feel for changes  1. Lie on your back on the floor. 2. Feel each breast. To do this, follow these steps: ? Pick a breast to feel. ? Put the arm closest to that breast above your head. ? Use your other arm to feel the nipple area of your breast. Feel the area with the pads of your three middle fingers by making small circles with your fingers. For the first circle, press lightly. For the second circle, press harder. For the third circle, press even harder. ? Keep making circles with  your fingers at the different pressures as you move down your breast. Stop when you feel your ribs. ? Move your fingers a little toward the center of your body. ? Start making circles with your fingers again, this time going up until you reach your collarbone. ? Keep making up-and-down circles until you reach your armpit. Remember to keep using the three pressures. ? Feel the other breast in the same way. 3. Sit or stand in the tub or shower. 4. With soapy water on your skin, feel each breast the same way you did in step 2 when you were lying on the floor. Write down what you find Writing down what you find can help you remember what to tell your doctor. Write down:  What is normal for each breast.  Any changes you find in each breast, including: ? The kind of changes you find. ? Whether you have pain. ? Size and location of any lumps.  When you last had your menstrual period. General tips  Check your breasts every month.  If you are breastfeeding, the best time to check your breasts is after you feed your baby or after you use a breast pump.  If you get menstrual periods, the best time to check your breasts is 5-7 days after your menstrual period is over.  With time, you will become comfortable with the self-exam, and you will begin to know if there are changes in your breasts. Contact a doctor if you:  See a change in the shape or size of your breasts or nipples.  See a change in the skin of your breast or nipples, such as red or scaly skin.  Have fluid coming from your nipples that is not normal.  Find a lump or thick area that was not there before.  Have pain in your breasts.  Have any concerns about your breast health. Summary  Breast self-awareness includes looking for changes in your breasts, as well as feeling for changes within your breasts.  Breast self-awareness should be done in front of a mirror in a well-lit room.  You should check your breasts every month.  If you get menstrual periods, the best time to check your breasts is 5-7 days after your menstrual period is over.  Let your doctor know of any changes you see in your breasts, including changes in size, changes on the skin, pain or tenderness, or fluid from your nipples that is not normal. This information is not   is not intended to replace advice given to you by your health care provider. Make sure you discuss any questions you have with your health care provider. Document Revised: 05/25/2018 Document Reviewed: 05/25/2018 Elsevier Patient Education  [place].

## 2020-10-10 NOTE — Progress Notes (Signed)
Annual Exam-Pt stated that she was doing well. Pt declined flu vaccine.

## 2020-10-11 ENCOUNTER — Ambulatory Visit (INDEPENDENT_AMBULATORY_CARE_PROVIDER_SITE_OTHER): Payer: BC Managed Care – PPO | Admitting: Obstetrics and Gynecology

## 2020-10-11 ENCOUNTER — Other Ambulatory Visit: Payer: Self-pay

## 2020-10-11 ENCOUNTER — Encounter: Payer: Self-pay | Admitting: Obstetrics and Gynecology

## 2020-10-11 ENCOUNTER — Other Ambulatory Visit (HOSPITAL_COMMUNITY)
Admission: RE | Admit: 2020-10-11 | Discharge: 2020-10-11 | Disposition: A | Discharge status: Home / Self Care | Payer: BC Managed Care – PPO | Source: Ambulatory Visit | Attending: Obstetrics and Gynecology | Admitting: Obstetrics and Gynecology

## 2020-10-11 VITALS — BP 92/68 | HR 83 | Ht 63.0 in | Wt 119.3 lb

## 2020-10-11 DIAGNOSIS — N903 Dysplasia of vulva, unspecified: Secondary | ICD-10-CM | POA: Diagnosis not present

## 2020-10-11 DIAGNOSIS — Z01419 Encounter for gynecological examination (general) (routine) without abnormal findings: Secondary | ICD-10-CM | POA: Diagnosis not present

## 2020-10-11 DIAGNOSIS — Z1231 Encounter for screening mammogram for malignant neoplasm of breast: Secondary | ICD-10-CM

## 2020-10-11 DIAGNOSIS — M810 Age-related osteoporosis without current pathological fracture: Secondary | ICD-10-CM

## 2020-10-11 DIAGNOSIS — N952 Postmenopausal atrophic vaginitis: Secondary | ICD-10-CM

## 2020-10-11 DIAGNOSIS — Z8742 Personal history of other diseases of the female genital tract: Secondary | ICD-10-CM | POA: Insufficient documentation

## 2020-10-11 NOTE — Progress Notes (Signed)
ANNUAL PREVENTATIVE CARE GYNECOLOGY  ENCOUNTER NOTE  Subjective:       Amy Riley is a 54 y.o. G0P0 married female here for a routine annual gynecologic exam.   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 The patient is not sexually active. The patient has never take hormone replacement therapy. Patient denies post-menopausal vaginal bleeding. The patient wears seatbelts: yes. The patient participates in regular exercise: no. Has the patient ever been transfused or tattooed?: no.   Current complaints/concerns: 1. Patient reports that she recently had hearing aids placed within the past week due to tendinitis of the ears.    Gynecologic History No LMP recorded (lmp unknown). Patient is postmenopausal. Contraception: post menopausal status Last Pap: 10/06/2019. Results were: abnormal.  AGUS NOS, HPV neg. Normal colposcopy and endometrial biopsy performed in March.  Last mammogram: 11/23/2019. Results were: normal Last Colonoscopy: 05/18/2015.  Results were: normal Last Dexa Scan: 11/16/2018.  Osteoporosis present.    Obstetric History OB History  Gravida Para Term Preterm AB Living  0 0 0 0 0 0  SAB IAB Ectopic Multiple Live Births  0 0 0 0      Past Medical History:  Diagnosis Date  . Broken foot   . 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 GERD (gastroesophageal reflux disease)   . Hypothyroidism     Family History  Problem Relation Age of Onset  . Heart disease Father        atrial fib  . Cancer Father        colon cancer  . Hypertension Father   . Stroke Father   . Heart disease Maternal Uncle        CAD  . Cancer Paternal Aunt        colon cancer  . Asthma Neg Hx   . Diabetes Neg Hx   . Ovarian cancer Neg Hx   . Breast cancer Neg Hx     Past Surgical History:  Procedure Laterality Date  . CHOLECYSTECTOMY, LAPAROSCOPIC  12/16   [facility]  . DORSAL COMPARTMENT RELEASE Right 10/12/2019   wrist  . NASAL SEPTOPLASTY W/  TURBINOPLASTY  06/1986   reduction  . SHOULDER ARTHROSCOPY Right 03/2017  . SKIN GRAFT  11/2002   Nasal (for epistaxis)    Social History   Socioeconomic History  . Marital status: Married    Spouse name: Not on file  . Number of children: 0  . Years of education: Not on file  . Highest education level: Not on file  Occupational History  . 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 Smoking status: Never Smoker  . Smokeless tobacco: Never Used  Vaping Use  . Vaping Use: Never used  Substance and Sexual Activity  . Alcohol use: No    Alcohol/week: 0.0 standard drinks  . Drug use: No  . Sexual activity: Not Currently  Other Topics Concern  . Not on file  Social History Narrative   Regular exercise Yes   Social Determinants of Health   Financial Resource Strain: Not on file  Food Insecurity: Not on file  Transportation Needs: Not on file  Physical Activity: Not on file  Stress: Not on file  Social Connections: Not on file  Intimate Partner Violence: Not on file    Current Outpatient Medications on File Prior to Visit  Medication Sig Dispense Refill  . levothyroxine (SYNTHROID) 50  MCG tablet TAKE 1 TABLET BY MOUTH ONCE DAILY ON AN EMPTY STOMACH.WAIT 30 MINS BEFORE OTHER MEDS 90 tablet 3   No current facility-administered medications on file prior to visit.    Allergies  Allergen Reactions  . Acidophilus Other (See Comments)    Rectal spasms  . Aspirin     REACTION: nose bleeds  . Cefdinir Other (See Comments)  . Cephalosporins Other (See Comments)  . Nsaids Other (See Comments)    bleeding bleeding  . Tolmetin     Other reaction(s): Other (See Comments) bleeding      Review of Systems ROS Review of Systems - General ROS: negative for - chills, fever, hot flashes, night sweats, weight gain or weight loss.  Psychological ROS: negative for - anxiety, decreased libido, depression, mood swings, physical abuse or  sexual abuse Ophthalmic ROS: negative for - blurry vision, eye pain or loss of vision ENT ROS: negative for - headaches, hearing change, visual changes or vocal changes Allergy and Immunology ROS: negative for - hives, itchy/watery eyes or seasonal allergies Hematological and Lymphatic ROS: negative for - bleeding problems, bruising, swollen lymph nodes or weight loss Endocrine ROS: negative for - galactorrhea, hair pattern changes, hot flashes, malaise/lethargy, mood swings, palpitations, polydipsia/polyuria, skin changes, temperature intolerance or unexpected weight changes Breast ROS: negative for - new or changing breast lumps or nipple discharge Respiratory ROS: negative for - cough or shortness of breath Cardiovascular ROS: negative for - chest pain, irregular heartbeat, palpitations or shortness of breath Gastrointestinal ROS: no abdominal pain, change in bowel habits, or black or bloody stools Genito-Urinary ROS: no dysuria, trouble voiding, or hematuria Musculoskeletal ROS: negative for - joint pain or joint stiffness Neurological ROS: negative for - bowel and bladder control changes Dermatological ROS: negative for rash and skin lesion changes   Objective:   BP 92/68 (Patient Position: Sitting)   Pulse 83   Ht 5\' 3"  (1.6 m)   Wt 119 lb 5 oz (54.1 kg)   LMP  (LMP Unknown)   BMI 21.14 kg/m  CONSTITUTIONAL: Well-developed, well-nourished female in no acute distress.  PSYCHIATRIC: Normal mood and affect. Normal behavior. Normal judgment and thought content. [place]: Alert and oriented to person, place, and time. Normal muscle tone coordination. No cranial nerve deficit noted. HENT:  Normocephalic, atraumatic, External right and left ear normal. Oropharynx is clear and moist EYES: Conjunctivae and EOM are normal. Pupils are equal, round, and reactive to light. No scleral icterus.  NECK: Normal range of motion, supple, no masses.  Normal thyroid.  SKIN: Skin is warm and dry. No  rash noted. Not diaphoretic. No erythema. No pallor. CARDIOVASCULAR: Normal heart rate noted, regular rhythm, no murmur. RESPIRATORY: Clear to auscultation bilaterally. Effort and breath sounds normal, no problems with respiration noted. BREASTS: Symmetric in size. No masses, skin changes, nipple drainage, or lymphadenopathy. ABDOMEN: Soft, normal bowel sounds, no distention noted.  No tenderness, rebound or guarding.  BLADDER: Normal PELVIC:  Bladder no bladder distension noted  Urethra: normal appearing urethra with no masses, tenderness or lesions  Vulva: normal appearing vulva with no masses, tenderness or lesions  Vagina: mildly atrophic, no lesions, no discharge.  Used small speculum due to h/o discomfort.   Cervix: normal appearing cervix without discharge or lesions, mildly stenotic  Uterus: uterus is normal size, shape, consistency and nontender  Adnexa: normal adnexa in size, nontender and no masses  RV: External Exam NormaI, No Rectal Masses and Normal Sphincter tone  MUSCULOSKELETAL: Normal range of motion.  No tenderness.  No cyanosis, clubbing, or edema.  2+ distal pulses. LYMPHATIC: No Axillary, Supraclavicular, or Inguinal Adenopathy.   Labs: Labs reviewed from [facility] in [place].   Assessment:   Encounter for well woman exam with routine gynecological exam Osteoporosis, post-menopausal Vaginal atrophy VIN History of AGUS pap smear  Plan:  Pap: Pap Co Test performed due to h/o abnormal pap smear.  Mammogram: Ordered, due in February.   Stool Guaiac Testing:  Not Indicated. Patient due for repeat colonoscopy (q 5 years) as patient with family history of colon cancer.  Labs: None ordered.  Reviewed in [place].  Routine preventative health maintenance measures emphasized: Exercise/Diet/Weight control, Tobacco Warnings, Alcohol/Substance use risks and Stress Management. Osteoporosis, unable to take Calcium due to intolerance. Encourage Vitamin D intake.  To  discuss with PCP regarding other treatments. H/o VIN, stable, no new lesions. Last colposcopy last week was normal. Can continue yearly surveillance.  COVID vaccination status: declined.  Patient did experience COVID infection last year.  Flu vaccine: declined.  Return to [place], MD [facility]

## 2020-10-16 ENCOUNTER — Encounter: Payer: BC Managed Care – PPO | Admitting: Obstetrics and Gynecology

## 2020-10-18 LAB — CYTOLOGY - PAP: Diagnosis: NEGATIVE

## 2020-12-20 ENCOUNTER — Other Ambulatory Visit: Payer: Self-pay | Admitting: Obstetrics and Gynecology

## 2020-12-20 DIAGNOSIS — Z1231 Encounter for screening mammogram for malignant neoplasm of breast: Secondary | ICD-10-CM

## 2021-01-10 ENCOUNTER — Other Ambulatory Visit: Payer: Self-pay

## 2021-01-10 ENCOUNTER — Ambulatory Visit
Admission: RE | Admit: 2021-01-10 | Discharge: 2021-01-10 | Disposition: A | Discharge status: Home / Self Care | Payer: BC Managed Care – PPO | Source: Ambulatory Visit | Attending: Obstetrics and Gynecology | Admitting: Obstetrics and Gynecology

## 2021-01-10 DIAGNOSIS — Z1231 Encounter for screening mammogram for malignant neoplasm of breast: Secondary | ICD-10-CM | POA: Insufficient documentation

## 2021-06-28 ENCOUNTER — Other Ambulatory Visit: Payer: Self-pay | Admitting: Internal Medicine

## 2021-12-26 ENCOUNTER — Encounter: Payer: Self-pay | Admitting: Internal Medicine

## 2021-12-27 NOTE — Telephone Encounter (Signed)
Message sent to pt that Dr Silvio Pate is out of the office and there will be a delay in his response. ?

## 2021-12-31 NOTE — Progress Notes (Signed)
? ?ANNUAL PREVENTATIVE CARE GYNECOLOGY  ENCOUNTER NOTE ? ?Subjective:  ?    ? Amy Riley is a 56 y.o. G0P0 married female here for a routine annual gynecologic exam.   She has a history of VIN II-III and hypothyroidism. The patient is not sexually active. The patient has never take hormone replacement therapy. Patient denies post-menopausal vaginal bleeding. The patient wears seatbelts: yes. The patient participates in regular exercise: no. Has the patient ever been transfused or tattooed?: no.  ? ?Current complaints/concerns: ?1. Patient reports that over the last few weeks she has noted tenderness in the left vulvar area. Has used a cool compress for relief.  Notes that when she examines, she noted some pale white areas.  Is concerned that her VIN is returning.  Also occasionally notes some mild vaginal irritation. Denies vaginal discharge or odor.  ? ? ?Gynecologic History ?No LMP recorded (lmp unknown). Patient is postmenopausal. ?Contraception: post menopausal status ?Last Pap: 10/11/2020. Results were: normal. Previous pap in 2020 abnormal.  AGUS NOS, HPV neg. Normal colposcopy and endometrial biopsy performed in March.  ?Last mammogram: 01/10/2021. Results were: normal ?Last Colonoscopy: 05/18/2015.  Results were: normal. For repeatin 10 years.  ?Last Dexa Scan: 11/16/2018.  Osteoporosis present.  ? ? ?Obstetric History ?OB History  ?Gravida Para Term Preterm AB Living  ?0 0 0 0 0 0  ?SAB IAB Ectopic Multiple Live Births  ?0 0 0 0    ? ? ?Past Medical History:  ?Diagnosis Date  ? Broken foot   ? Cancer [facility])   ? basal cell  ? GERD (gastroesophageal reflux disease)   ? Hypothyroidism   ? ? ?Family History  ?Problem Relation Age of Onset  ? Heart disease Father   ?     atrial fib  ? Cancer Father   ?     colon cancer  ? Hypertension Father   ? Stroke Father   ? Heart disease Maternal Uncle   ?     CAD  ? Cancer Paternal Aunt   ?     colon cancer  ? Asthma Neg Hx   ? Diabetes Neg Hx   ? Ovarian cancer Neg Hx    ? Breast cancer Neg Hx   ? ? ?Past Surgical History:  ?Procedure Laterality Date  ? CHOLECYSTECTOMY, LAPAROSCOPIC  12/16  ? [facility]  ? DORSAL COMPARTMENT RELEASE Right 10/12/2019  ? wrist  ? NASAL SEPTOPLASTY W/ TURBINOPLASTY  06/1986  ? reduction  ? SHOULDER ARTHROSCOPY Right 03/2017  ? SKIN GRAFT  11/2002  ? Nasal (for epistaxis)  ? ? ?Social History  ? ?Socioeconomic History  ? Marital status: Married  ?  Spouse name: Not on file  ? Number of children: 0  ? Years of education: Not on file  ? Highest education level: Not on file  ?Occupational History  ? 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 Smoking status: Never  ? Smokeless tobacco: Never  ?Vaping Use  ? Vaping Use: Never used  ?Substance and Sexual Activity  ? Alcohol use: No  ?  Alcohol/week: 0.0 standard drinks  ? Drug use: No  ? Sexual activity: Not Currently  ?Other Topics Concern  ? Not on file  ?Social History Narrative  ? Regular exercise Yes  ? ?Social Determinants of Health  ? ?Financial Resource Strain: Not on file  ?Food Insecurity: Not on file  ?Transportation Needs: Not on file  ?Physical Activity: Not on  file  ?Stress: Not on file  ?Social Connections: Not on file  ?Intimate Partner Violence: Not on file  ? ? ?Current Outpatient Medications on File Prior to Visit  ?Medication Sig Dispense Refill  ? levothyroxine (SYNTHROID) 50 MCG tablet TAKE 1 TABLET BY MOUTH ONCE DAILY ON AN EMPTY STOMACH.WAIT 30 MINS BEFORE OTHER MEDS. NEEDS OFFICE VISIT 90 tablet 0  ? ?No current facility-administered medications on file prior to visit.  ? ? ?Allergies  ?Allergen Reactions  ? Acidophilus Other (See Comments)  ?  Rectal spasms  ? Aspirin   ?  REACTION: nose bleeds  ? Cefdinir Other (See Comments)  ? Cephalosporins Other (See Comments)  ? Nsaids Other (See Comments)  ?  bleeding ?bleeding  ? Tolmetin   ?  Other reaction(s): Other (See Comments) ?bleeding  ? ? ? ? ?Review of Systems ?ROS ?Review of Systems - General  ROS: negative for - chills, fever, hot flashes, night sweats, weight gain or weight loss.  ?Psychological ROS: negative for - anxiety, decreased libido, depression, mood swings, physical abuse or sexual abuse ?Ophthalmic ROS: negative for - blurry vision, eye pain or loss of vision ?ENT ROS: negative for - headaches, hearing change, visual changes or vocal changes ?Allergy and Immunology ROS: negative for - hives, itchy/watery eyes or seasonal allergies ?Hematological and Lymphatic ROS: negative for - bleeding problems, bruising, swollen lymph nodes or weight loss ?Endocrine ROS: negative for - galactorrhea, hair pattern changes, hot flashes, malaise/lethargy, mood swings, palpitations, polydipsia/polyuria, skin changes, temperature intolerance or unexpected weight changes ?Breast ROS: negative for - new or changing breast lumps or nipple discharge ?Respiratory ROS: negative for - cough or shortness of breath ?Cardiovascular ROS: negative for - chest pain, irregular heartbeat, palpitations or shortness of breath ?Gastrointestinal ROS: no abdominal pain, change in bowel habits, or black or bloody stools ?Genito-Urinary ROS: no dysuria, trouble voiding, or hematuria. Positive for vulvar/vaginal irritation.  ?Musculoskeletal ROS: negative for - joint pain or joint stiffness ?Neurological ROS: negative for - bowel and bladder control changes ?Dermatological ROS: negative for rash and skin lesion changes ?  ?Objective:  ? ?BP 113/61   Pulse 80   Resp 16   Ht '5\' 3"'$  (1.6 m)   Wt 119 lb 12.8 oz (54.3 kg)   LMP  (LMP Unknown)   BMI 21.22 kg/m?  ?CONSTITUTIONAL: Well-developed, well-nourished female in no acute distress.  ?PSYCHIATRIC: Normal mood and affect. Normal behavior. Normal judgment and thought content. ?[place]: Alert and oriented to person, place, and time. Normal muscle tone coordination. No cranial nerve deficit noted. ?HENT:  Normocephalic, atraumatic, External right and left ear normal. Oropharynx is  clear and moist ?EYES: Conjunctivae and EOM are normal. Pupils are equal, round, and reactive to light. No scleral icterus.  ?NECK: Normal range of motion, supple, no masses.  Normal thyroid.  ?SKIN: Skin is warm and dry. No rash noted. Not diaphoretic. No erythema. No pallor. ?CARDIOVASCULAR: Normal heart rate noted, regular rhythm, no murmur. ?RESPIRATORY: Clear to auscultation bilaterally. Effort and breath sounds normal, no problems with respiration noted. ?BREASTS: Symmetric in size. No masses, skin changes, nipple drainage, or lymphadenopathy. ?ABDOMEN: Soft, normal bowel sounds, no distention noted.  No tenderness, rebound or guarding.  ?BLADDER: Normal ?PELVIC: ? Bladder no bladder distension noted ? Urethra: normal appearing urethra with no masses, tenderness or lesions ? Vulva:  2 miniscule faint hypopigmented flat areas on right labia minora. No tenderness or lesions ? Vagina: mildly atrophic, no lesions, no discharge.  Used small  speculum due to h/o discomfort.  ? Cervix: normal appearing cervix without discharge or lesions, mildly stenotic ? Uterus: uterus is normal size, shape, consistency and nontender ? Adnexa: normal adnexa in size, nontender and no masses ? RV: External Exam NormaI, No Rectal Masses and Normal Sphincter tone  ?MUSCULOSKELETAL: Normal range of motion. No tenderness.  No cyanosis, clubbing, or edema.  2+ distal pulses. ?LYMPHATIC: No Axillary, Supraclavicular, or Inguinal Adenopathy. ? ? ?Labs: ?Labs reviewed from [facility] in [place].  ? ?Assessment:  ? ?Encounter for well woman exam with routine gynecological exam ?Osteoporosis, post-menopausal ?Vaginal atrophy ?VIN ?History of abnormal pap smear  ? ?Plan:  ?Pap: Pap Co Test performed due to h/o abnormal pap smear. If normal, can return to routine screening.  ?Mammogram: Ordered ?Stool Guaiac Testing:  Not Indicated. Patient due for repeat colonoscopy (q 5 years) as patient with family history of colon cancer.  ?Labs:  None  ordered.   Reviewed in [place].  ?Routine preventative health maintenance measures emphasized: Exercise/Diet/Weight control, Tobacco Warnings, Alcohol/Substance use risks and Stress Management. ?Osteoporos

## 2022-01-01 ENCOUNTER — Encounter: Payer: Self-pay | Admitting: Obstetrics and Gynecology

## 2022-01-01 ENCOUNTER — Ambulatory Visit (INDEPENDENT_AMBULATORY_CARE_PROVIDER_SITE_OTHER): Payer: BC Managed Care – PPO | Admitting: Obstetrics and Gynecology

## 2022-01-01 ENCOUNTER — Other Ambulatory Visit: Payer: Self-pay

## 2022-01-01 ENCOUNTER — Other Ambulatory Visit (HOSPITAL_COMMUNITY)
Admission: RE | Admit: 2022-01-01 | Discharge: 2022-01-01 | Disposition: A | Discharge status: Home / Self Care | Payer: BC Managed Care – PPO | Source: Ambulatory Visit | Attending: Obstetrics and Gynecology | Admitting: Obstetrics and Gynecology

## 2022-01-01 VITALS — BP 113/61 | HR 80 | Resp 16 | Ht 63.0 in | Wt 119.8 lb

## 2022-01-01 DIAGNOSIS — Z1231 Encounter for screening mammogram for malignant neoplasm of breast: Secondary | ICD-10-CM

## 2022-01-01 DIAGNOSIS — Z01419 Encounter for gynecological examination (general) (routine) without abnormal findings: Secondary | ICD-10-CM

## 2022-01-01 DIAGNOSIS — N952 Postmenopausal atrophic vaginitis: Secondary | ICD-10-CM

## 2022-01-01 DIAGNOSIS — Z124 Encounter for screening for malignant neoplasm of cervix: Secondary | ICD-10-CM | POA: Insufficient documentation

## 2022-01-01 DIAGNOSIS — M810 Age-related osteoporosis without current pathological fracture: Secondary | ICD-10-CM

## 2022-01-01 DIAGNOSIS — Z8742 Personal history of other diseases of the female genital tract: Secondary | ICD-10-CM

## 2022-01-01 DIAGNOSIS — N903 Dysplasia of vulva, unspecified: Secondary | ICD-10-CM | POA: Diagnosis not present

## 2022-01-01 NOTE — Patient Instructions (Addendum)
Types of vaginal moisturizers ?There are three main types of vaginal moisturizers: ? ?The first is a creams, which is usually applied with an applicator or your finger directly into the vagina (examples include Premarin cream).  ? ?There are also suppositories, which are pills and capsules that you place inside your vagina (Brand names include Vagifem, Imvexxy, and Intrarosa (this one is testosterone based)).  ? ?Lastly, natural oils can also work as vaginal moisturizers. Sometimes, oils come in capsule form, like vitamin E liquid capsules. Coconut oil and almond oil may also be good options. ?

## 2022-01-06 LAB — CYTOLOGY - PAP
Diagnosis: NEGATIVE
Diagnosis: REACTIVE

## 2022-01-17 ENCOUNTER — Encounter: Payer: Self-pay | Admitting: Internal Medicine

## 2022-01-17 MED ORDER — LEVOTHYROXINE SODIUM 50 MCG PO TABS: ORAL_TABLET | ORAL | 0 refills | Status: DC

## 2022-01-17 NOTE — Telephone Encounter (Signed)
Last TSH in our system was 2/21. Will forward to Dr Silvio Pate for approval. ?

## 2022-02-26 ENCOUNTER — Ambulatory Visit
Admission: RE | Admit: 2022-02-26 | Discharge: 2022-02-26 | Disposition: A | Discharge status: Home / Self Care | Payer: BC Managed Care – PPO | Source: Ambulatory Visit | Attending: Obstetrics and Gynecology | Admitting: Obstetrics and Gynecology

## 2022-02-26 DIAGNOSIS — Z1231 Encounter for screening mammogram for malignant neoplasm of breast: Secondary | ICD-10-CM | POA: Diagnosis present

## 2022-02-26 DIAGNOSIS — Z01419 Encounter for gynecological examination (general) (routine) without abnormal findings: Secondary | ICD-10-CM | POA: Diagnosis present

## 2022-03-19 ENCOUNTER — Ambulatory Visit (INDEPENDENT_AMBULATORY_CARE_PROVIDER_SITE_OTHER): Payer: BC Managed Care – PPO | Admitting: Internal Medicine

## 2022-03-19 ENCOUNTER — Encounter: Payer: Self-pay | Admitting: Internal Medicine

## 2022-03-19 VITALS — BP 104/60 | HR 74 | Temp 97.6°F | Ht 63.0 in | Wt 119.0 lb

## 2022-03-19 DIAGNOSIS — Z0000 Encounter for general adult medical examination without abnormal findings: Secondary | ICD-10-CM

## 2022-03-19 DIAGNOSIS — E039 Hypothyroidism, unspecified: Secondary | ICD-10-CM

## 2022-03-19 LAB — LIPID PANEL
Cholesterol: 216 mg/dL — ABNORMAL HIGH (ref 0–200)
HDL: 87.8 mg/dL (ref >39.00)
LDL Cholesterol: 117 mg/dL — ABNORMAL HIGH (ref 0–99)
NonHDL: 128.64
Total CHOL/HDL Ratio: 2
Triglycerides: 58 mg/dL (ref 0.0–149.0)
VLDL: 11.6 mg/dL (ref 0.0–40.0)

## 2022-03-19 LAB — COMPREHENSIVE METABOLIC PANEL
ALT: 15 U/L (ref 0–35)
AST: 22 U/L (ref 0–37)
Albumin: 4.4 g/dL (ref 3.5–5.2)
Alkaline Phosphatase: 81 U/L (ref 39–117)
BUN: 15 mg/dL (ref 6–23)
CO2: 29 mEq/L (ref 19–32)
Calcium: 9.8 mg/dL (ref 8.4–10.5)
Chloride: 104 mEq/L (ref 96–112)
Creatinine, Ser: 0.77 mg/dL (ref 0.40–1.20)
GFR: 85.69 mL/min (ref >60.00)
Glucose, Bld: 76 mg/dL (ref 70–99)
Potassium: 4.1 mEq/L (ref 3.5–5.1)
Sodium: 139 mEq/L (ref 135–145)
Total Bilirubin: 0.9 mg/dL (ref 0.2–1.2)
Total Protein: 7.2 g/dL (ref 6.0–8.3)

## 2022-03-19 LAB — CBC
HCT: 39.6 % (ref 36.0–46.0)
Hemoglobin: 13.2 g/dL (ref 12.0–15.0)
MCHC: 33.4 g/dL (ref 30.0–36.0)
MCV: 91.1 fl (ref 78.0–100.0)
Platelets: 160 10*3/uL (ref 150.0–400.0)
RBC: 4.34 Mil/uL (ref 3.87–5.11)
RDW: 13.3 % (ref 11.5–15.5)
WBC: 5.5 10*3/uL (ref 4.0–10.5)

## 2022-03-19 LAB — TSH: TSH: 4.22 u[IU]/mL (ref 0.35–5.50)

## 2022-03-19 LAB — T4, FREE: Free T4: 0.87 ng/dL (ref 0.60–1.60)

## 2022-03-19 MED ORDER — LEVOTHYROXINE SODIUM 50 MCG PO TABS: ORAL_TABLET | ORAL | 3 refills | Status: DC

## 2022-03-19 NOTE — Assessment & Plan Note (Signed)
Seems euthryoid She probably only remembers every other day Will check labs

## 2022-03-19 NOTE — Progress Notes (Signed)
Subjective:    Patient ID: Amy Riley, female    DOB: 07-12-1966, 56 y.o.   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 she works from home  Has some swelling in lateral ankles---right more than left Occasionally uncomfortably Seems to be stable morning to night No injuries and not limited (but does have occ soreness) Not exercising much lately--still does some ab work  Has noticed muscle loss with menopause and gallbladder removal Wonders about muscle changes on skull---she feels a "dip" in skull  Ongoing tinnitus--louder as time goes on Sleeps with sound machine to distract her--and sleeps well Trying to wean off tylenol PM---trying melatonin Some mild hearing loss No vertigo---rare feeling of being off balance  Current Outpatient Medications on File Prior to Visit  Medication Sig Dispense Refill   levothyroxine (SYNTHROID) 50 MCG tablet TAKE 1 TABLET BY MOUTH ONCE DAILY ON AN EMPTY STOMACH.WAIT 30 MINS BEFORE OTHER MEDS. NEEDS OFFICE VISIT 90 tablet 0   No current facility-administered medications on file prior to visit.    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   GERD (gastroesophageal reflux disease)    Hypothyroidism     Past Surgical History:  Procedure Laterality Date   CHOLECYSTECTOMY, LAPAROSCOPIC  12/16   [facility]   DORSAL COMPARTMENT RELEASE Right 10/12/2019   wrist   NASAL SEPTOPLASTY W/  TURBINOPLASTY  06/1986   reduction   SHOULDER ARTHROSCOPY Right 03/2017   SKIN GRAFT  11/2002   Nasal (for epistaxis)    Family History  Problem Relation Age of Onset   Heart disease Father        atrial fib   Cancer Father        colon cancer   Hypertension Father    Stroke Father    Heart disease Maternal Uncle        CAD   Cancer Paternal Aunt        colon cancer   Asthma Neg Hx    Diabetes Neg Hx    Ovarian cancer Neg Hx    Breast cancer Neg Hx     Social History   Socioeconomic History   Marital status: Married    Spouse name: Not on file   Number of children: 0   Years of education: Not on file   Highest education level: Not on file  Occupational History   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  Tobacco Use   Smoking status: Never   Smokeless tobacco: Never  Vaping Use   Vaping Use: Never used  Substance and Sexual Activity   Alcohol use: No    Alcohol/week: 0.0 standard drinks   Drug use: No   Sexual activity: Not Currently  Other Topics Concern   Not on file  Social History Narrative   Regular exercise Yes  Social Determinants of Health   Financial Resource Strain: Not on file  Food Insecurity: Not on file  Transportation Needs: Not on file  Physical Activity: Not on file  Stress: Not on file  Social Connections: Not on file  Intimate Partner Violence: Not on file   Review of Systems  Constitutional:  Negative for fatigue and unexpected weight change.       Wears seat belt  HENT:  Positive for hearing loss and tinnitus. Negative for dental problem.        Did invisalign and keeps up with dentist  Eyes:  Negative for visual disturbance.       No diplopia or unilateral vision loss  Respiratory:  Negative for cough, chest tightness and shortness of breath.   Cardiovascular:  Negative for chest pain and leg swelling.       Will have occasional skip  Gastrointestinal:  Negative for blood in stool and  constipation.       Occ heartburn---uses prilosec prn  Endocrine: Negative for polydipsia and polyuria.  Genitourinary:  Negative for difficulty urinating, dyspareunia and hematuria.  Musculoskeletal:  Negative for arthralgias, back pain and joint swelling.  Skin:  Negative for rash.       No suspicious lesions  Allergic/Immunologic: Positive for environmental allergies. Negative for immunocompromised state.       Worse season this year--didn't use meds though (mostly a bad sinus thing)  Neurological:  Negative for dizziness, syncope and light-headedness.       Headaches are better now  Hematological:  Negative for adenopathy. Bruises/bleeds easily.  Psychiatric/Behavioral:  Negative for dysphoric mood and sleep disturbance. The patient is not nervous/anxious.       Objective:   Physical Exam Constitutional:      Appearance: Normal appearance.  HENT:     Head:     Comments: Mild skull abnormality right parietal    Mouth/Throat:     Pharynx: No oropharyngeal exudate or posterior oropharyngeal erythema.  Eyes:     Conjunctiva/sclera: Conjunctivae normal.     Pupils: Pupils are equal, round, and reactive to light.  Cardiovascular:     Rate and Rhythm: Normal rate and regular rhythm.     Pulses: Normal pulses.     Heart sounds: No murmur heard.   No gallop.  Pulmonary:     Effort: Pulmonary effort is normal.     Breath sounds: Normal breath sounds. No wheezing or rales.  Abdominal:     Palpations: Abdomen is soft.     Tenderness: There is no abdominal tenderness.  Musculoskeletal:     Cervical back: Neck supple.     Right lower leg: No edema.     Left lower leg: No edema.     Comments: Slight swelling near both lateral malleoli (looks like reaction to shoe irritation)  Lymphadenopathy:     Cervical: No cervical adenopathy.  Skin:    Findings: No lesion or rash.  Neurological:     General: No focal deficit present.     Mental Status: She is alert and oriented to person,  place, and time.  Psychiatric:        Mood and Affect: Mood normal.        Behavior: Behavior normal.           Assessment & Plan:

## 2022-03-19 NOTE — Assessment & Plan Note (Signed)
Healthy Colon due 2026 Recent pap normal  Yearly mammograms--- recent Discussed increasing exercise again Prefers no flu, COVID or shingles vaccines

## 2022-07-02 ENCOUNTER — Encounter: Payer: Self-pay | Admitting: Internal Medicine

## 2022-09-17 ENCOUNTER — Ambulatory Visit: Payer: BC Managed Care – PPO | Admitting: Obstetrics and Gynecology

## 2022-09-17 ENCOUNTER — Encounter: Payer: Self-pay | Admitting: Obstetrics and Gynecology

## 2022-09-17 VITALS — BP 116/73 | HR 87 | Resp 16 | Ht 63.0 in | Wt 119.1 lb

## 2022-09-17 DIAGNOSIS — N952 Postmenopausal atrophic vaginitis: Secondary | ICD-10-CM

## 2022-09-17 DIAGNOSIS — N905 Atrophy of vulva: Secondary | ICD-10-CM

## 2022-09-17 DIAGNOSIS — N898 Other specified noninflammatory disorders of vagina: Secondary | ICD-10-CM

## 2022-09-17 NOTE — Progress Notes (Signed)
    GYNECOLOGY PROGRESS NOTE  Subjective:    Patient ID: Amy Riley, female    DOB: [birth year], 56 y.o.   MRN: [id number]  HPI  Patient is a 56 y.o. G0P0000 female who presents for evaluation of vaginal irritation x 1-2 years. It comes and goes, worse when she is sitting. Feels "prickly like a rug burn". She has tried to resolve symptoms with hydrocortisone with little relief. She denies vaginal discharge or bleeding.  Notes that this all started once she began working from home, sitting more and wearing more pajamas instead of work clothes.  Is married but not sexually active due to husband's medical problems.    The following portions of the patient's history were reviewed and updated as appropriate: allergies, current medications, past family history, past medical history, past social history, past surgical history, and problem list.  Review of Systems Pertinent items noted in HPI and remainder of comprehensive ROS otherwise negative.   Objective:   Blood pressure 116/73, pulse 87, resp. rate 16, height '5\' 3"'$  (1.6 m), weight 119 lb 1.6 oz (54 kg).  Body mass index is 21.1 kg/m. General appearance: alert, cooperative, and no distress Abdomen: soft, non-tender; bowel sounds normal; no masses,  no organomegaly Pelvic: vulva with mild thinning of skin, mild atrophy. No lesions.  Vaginal with mild to moderate atrophy, no discharge or lesions. Bimanual exam deferred.  Extremities: extremities normal, atraumatic, no cyanosis or edema Neurologic: Grossly normal   Assessment:   1. Vulvar atrophy   2. Vaginal atrophy      Plan:   Vulvar (and vaginal) atrophy - discussed that this was likely due to dryness of vulvar/vaginal region due to menopause, as well as more constant friction as patient is sitting more at home and may not be wearing breathable attire.  Discussed use of OTC coconut or Vitamin E oil externally 1-2 times daily as needed for comfort. If still no relief, can consider use  of local estrogen therapy. Also discussed ergonomic chairs/standing desk for work to increase comfort when sitting and limit hours when she is seated.  Return to clinic for any scheduled appointments or for any gynecologic concerns as needed.     Rubie Maid, MD [place]

## 2022-10-27 ENCOUNTER — Encounter: Payer: Self-pay | Admitting: Internal Medicine

## 2022-10-27 MED ORDER — LEVOTHYROXINE SODIUM 50 MCG PO TABS: ORAL_TABLET | ORAL | 3 refills | Status: AC

## 2022-11-10 ENCOUNTER — Encounter: Payer: Self-pay | Admitting: Internal Medicine

## 2022-12-21 ENCOUNTER — Encounter: Payer: Self-pay | Admitting: Internal Medicine

## 2022-12-21 DIAGNOSIS — Z1211 Encounter for screening for malignant neoplasm of colon: Secondary | ICD-10-CM

## 2022-12-23 ENCOUNTER — Encounter: Payer: Self-pay | Admitting: *Deleted

## 2023-02-09 ENCOUNTER — Encounter: Payer: Self-pay | Admitting: Internal Medicine

## 2023-02-09 DIAGNOSIS — Z1211 Encounter for screening for malignant neoplasm of colon: Secondary | ICD-10-CM

## 2023-02-10 ENCOUNTER — Other Ambulatory Visit: Payer: Self-pay | Admitting: Obstetrics and Gynecology

## 2023-02-10 DIAGNOSIS — Z1231 Encounter for screening mammogram for malignant neoplasm of breast: Secondary | ICD-10-CM

## 2023-03-01 IMAGING — MG MM DIGITAL SCREENING BILAT W/ TOMO AND CAD
8 series · 9 of 24 positions shown · non-contrast
Comparison: Previous exam(s).

CLINICAL DATA: Screening.

EXAM:
DIGITAL SCREENING BILATERAL MAMMOGRAM WITH TOMOSYNTHESIS AND CAD
TECHNIQUE: Bilateral screening digital craniocaudal and mediolateral oblique
mammograms were obtained. Bilateral screening digital breast
tomosynthesis was performed. The images were evaluated with
computer-aided detection.

[L CC synth-2D]
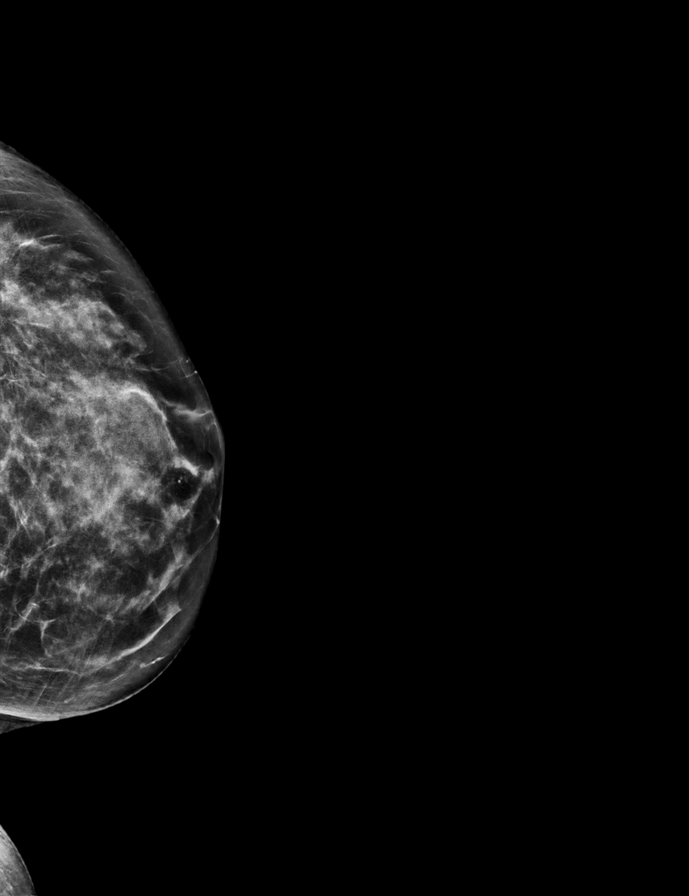

[L MLO synth-2D]
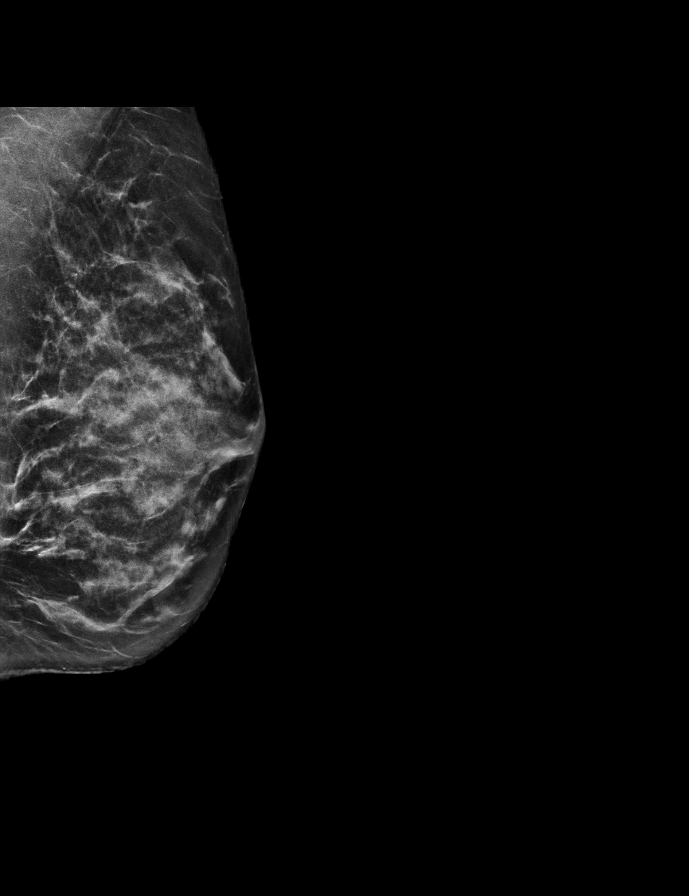

[R CC synth-2D]
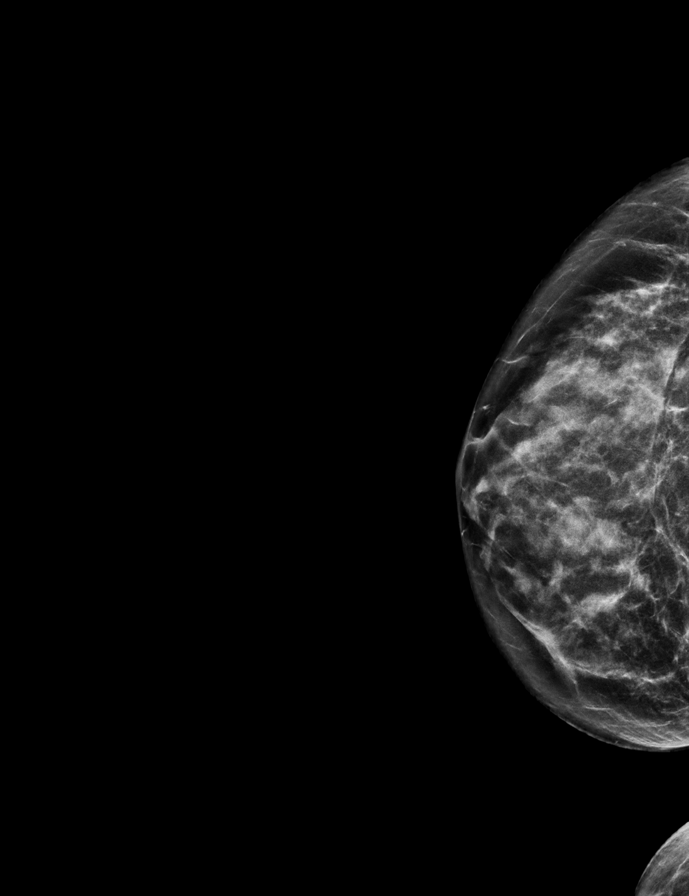

[R MLO synth-2D]
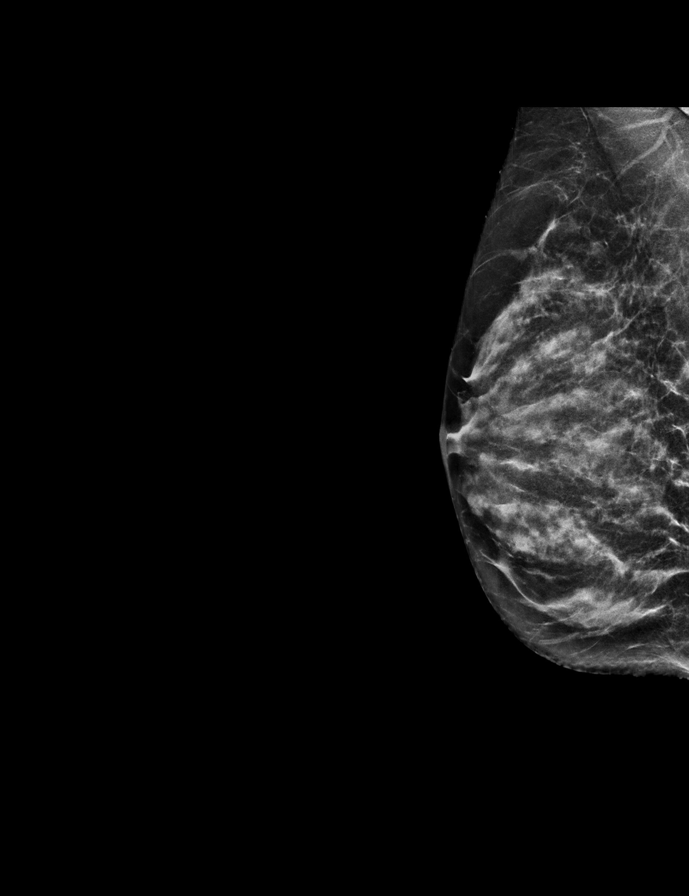

[R CC tomo · 2 of 54 frames shown]
[frame 18/54]
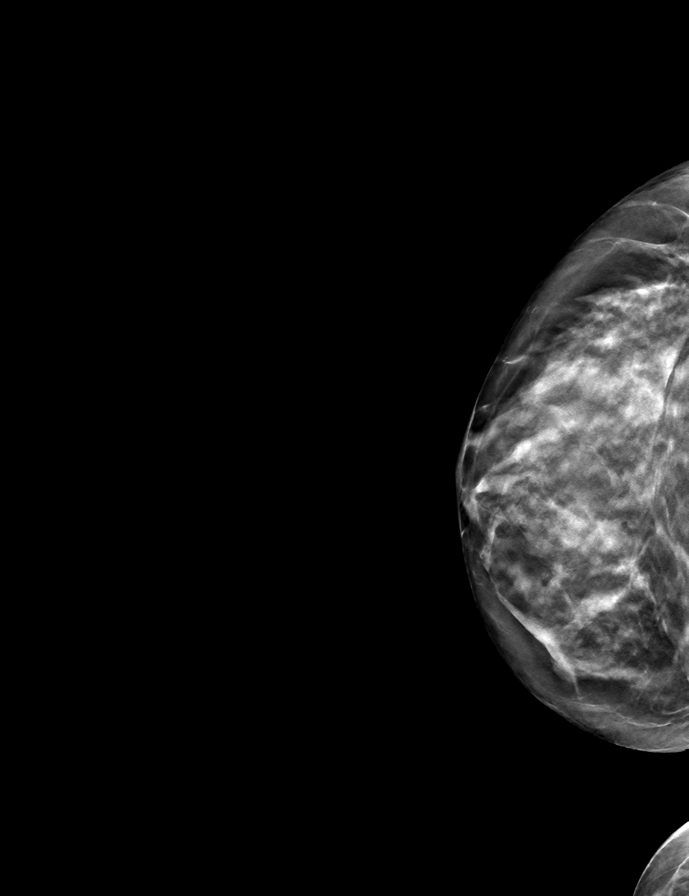
[frame 27/54]
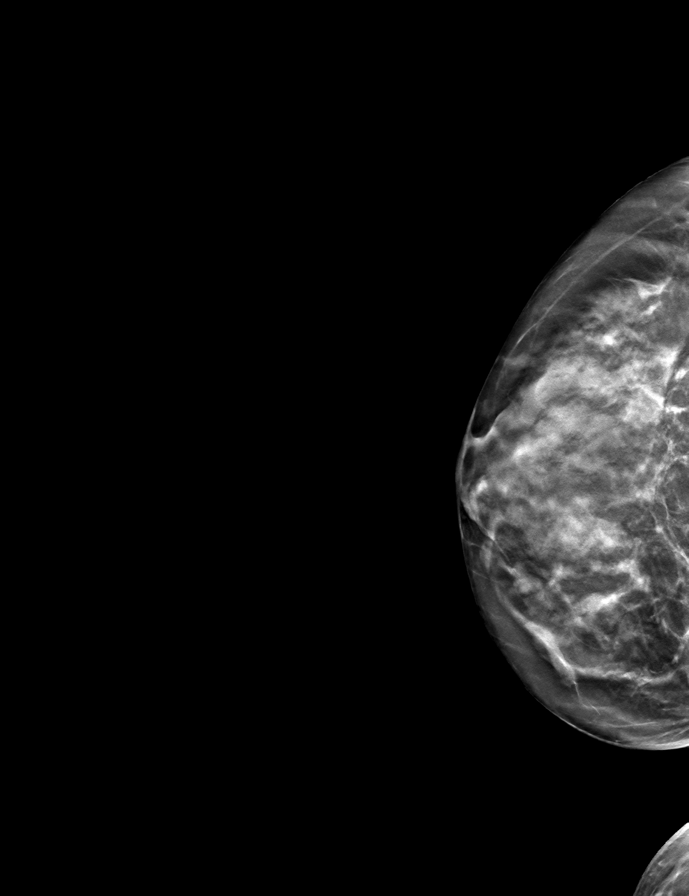

[L MLO tomo · tomo slice 26/51.0]
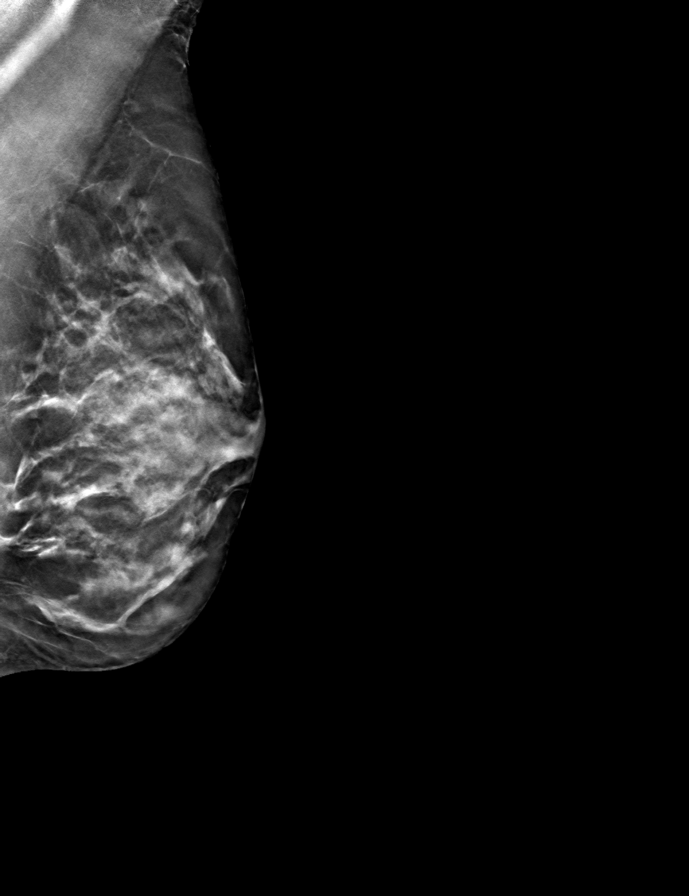

[L CC tomo · tomo slice 27/53.0]
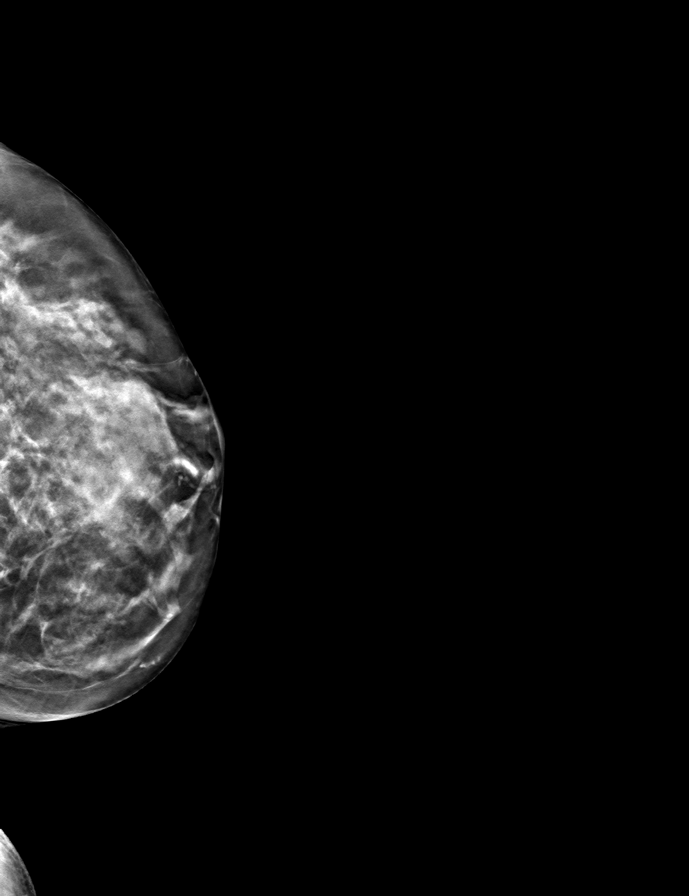

[R MLO tomo · tomo slice 25/50.0]
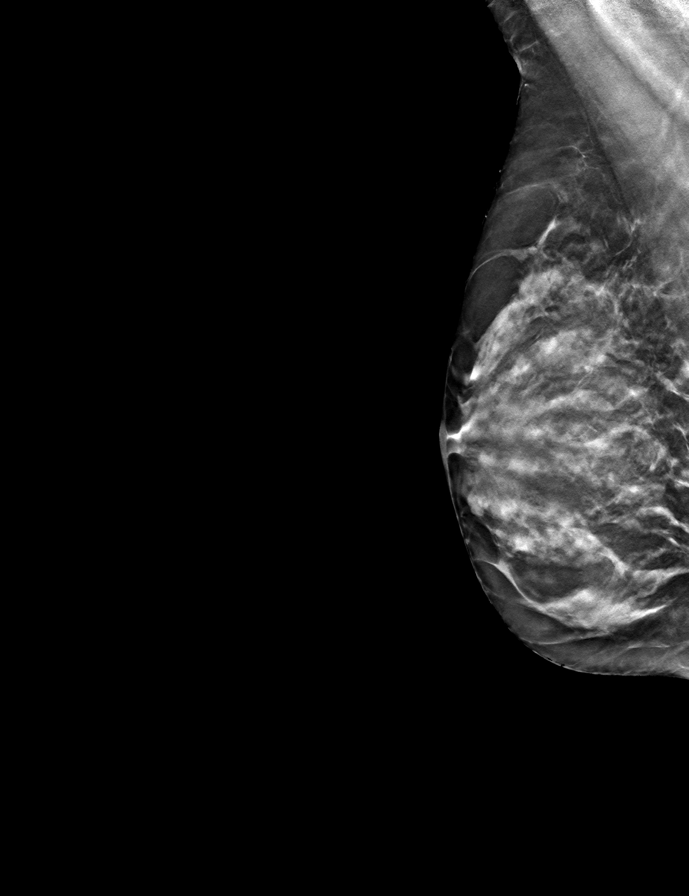

[9 of 24 positions shown; findings below may reference images not displayed]

ACR Breast Density Category d: The breast tissue is extremely dense,
which lowers the sensitivity of mammography
FINDINGS: There are no findings suspicious for malignancy.
IMPRESSION: No mammographic evidence of malignancy. A result letter of this
screening mammogram will be mailed directly to the patient.

RECOMMENDATION:
Screening mammogram in one year. (Code:[id number]-[id number])

BI-RADS CATEGORY  1: Negative.

## 2023-03-09 ENCOUNTER — Ambulatory Visit
Admission: RE | Admit: 2023-03-09 | Discharge: 2023-03-09 | Disposition: A | Discharge status: Home / Self Care | Payer: BC Managed Care – PPO | Source: Ambulatory Visit | Attending: Obstetrics and Gynecology | Admitting: Obstetrics and Gynecology

## 2023-03-09 DIAGNOSIS — Z1231 Encounter for screening mammogram for malignant neoplasm of breast: Secondary | ICD-10-CM | POA: Diagnosis present

## 2023-03-24 ENCOUNTER — Encounter: Payer: BC Managed Care – PPO | Admitting: Internal Medicine

## 2023-03-30 ENCOUNTER — Encounter: Payer: Self-pay | Admitting: Internal Medicine

## 2023-03-30 ENCOUNTER — Ambulatory Visit (INDEPENDENT_AMBULATORY_CARE_PROVIDER_SITE_OTHER): Payer: BC Managed Care – PPO | Admitting: Internal Medicine

## 2023-03-30 VITALS — BP 100/70 | HR 76 | Temp 97.0°F | Ht 62.5 in | Wt 120.0 lb

## 2023-03-30 DIAGNOSIS — K219 Gastro-esophageal reflux disease without esophagitis: Secondary | ICD-10-CM

## 2023-03-30 DIAGNOSIS — Z0000 Encounter for general adult medical examination without abnormal findings: Secondary | ICD-10-CM | POA: Diagnosis not present

## 2023-03-30 DIAGNOSIS — E039 Hypothyroidism, unspecified: Secondary | ICD-10-CM

## 2023-03-30 LAB — LIPID PANEL
Cholesterol: 219 mg/dL — ABNORMAL HIGH (ref 0–200)
HDL: 89 mg/dL (ref >39.00)
LDL Cholesterol: 117 mg/dL — ABNORMAL HIGH (ref 0–99)
NonHDL: 129.76
Total CHOL/HDL Ratio: 2
Triglycerides: 62 mg/dL (ref 0.0–149.0)
VLDL: 12.4 mg/dL (ref 0.0–40.0)

## 2023-03-30 LAB — COMPREHENSIVE METABOLIC PANEL
ALT: 14 U/L (ref 0–35)
AST: 22 U/L (ref 0–37)
Albumin: 4.6 g/dL (ref 3.5–5.2)
Alkaline Phosphatase: 84 U/L (ref 39–117)
BUN: 15 mg/dL (ref 6–23)
CO2: 28 mEq/L (ref 19–32)
Calcium: 9.7 mg/dL (ref 8.4–10.5)
Chloride: 102 mEq/L (ref 96–112)
Creatinine, Ser: 0.71 mg/dL (ref 0.40–1.20)
GFR: 93.78 mL/min (ref >60.00)
Glucose, Bld: 73 mg/dL (ref 70–99)
Potassium: 4.1 mEq/L (ref 3.5–5.1)
Sodium: 139 mEq/L (ref 135–145)
Total Bilirubin: 0.9 mg/dL (ref 0.2–1.2)
Total Protein: 7.6 g/dL (ref 6.0–8.3)

## 2023-03-30 LAB — T4, FREE: Free T4: 0.85 ng/dL (ref 0.60–1.60)

## 2023-03-30 LAB — CBC
HCT: 41.6 % (ref 36.0–46.0)
Hemoglobin: 13.7 g/dL (ref 12.0–15.0)
MCHC: 32.9 g/dL (ref 30.0–36.0)
MCV: 91.4 fl (ref 78.0–100.0)
Platelets: 164 10*3/uL (ref 150.0–400.0)
RBC: 4.55 Mil/uL (ref 3.87–5.11)
RDW: 13.6 % (ref 11.5–15.5)
WBC: 6.3 10*3/uL (ref 4.0–10.5)

## 2023-03-30 LAB — TSH: TSH: 4.58 u[IU]/mL (ref 0.35–5.50)

## 2023-03-30 NOTE — Assessment & Plan Note (Signed)
Seems to be euthyroid Will check labs 

## 2023-03-30 NOTE — Assessment & Plan Note (Signed)
Uses prilosec prn only

## 2023-03-30 NOTE — Progress Notes (Signed)
Subjective:    Patient ID: Amy Riley, female    DOB: 09/06/1966, 57 y.o.   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 90 tablet 3   No current facility-administered medications on file prior to visit.    Allergies  Allergen Reactions   Acidophilus Other (See Comments)    Rectal spasms   Aspirin     REACTION: nose bleeds   Cefdinir Other (See Comments)   Cephalosporins Other (See Comments)   Nsaids Other (See Comments)    bleeding bleeding   Tolmetin     Other reaction(s): Other (See Comments) bleeding    Past Medical History:  Diagnosis Date   Broken foot    Cancer (HCC)    basal cell   GERD (gastroesophageal reflux disease)    Hypothyroidism     Past Surgical History:  Procedure Laterality Date   CHOLECYSTECTOMY, LAPAROSCOPIC  12/16   [facility]   DORSAL COMPARTMENT RELEASE Right 10/12/2019   wrist   NASAL SEPTOPLASTY W/ TURBINOPLASTY  06/1986   reduction   SHOULDER ARTHROSCOPY Right 03/2017   SKIN GRAFT  11/2002   Nasal (for epistaxis)    Family History  Problem Relation Age of Onset   Heart disease Father        atrial fib   Cancer Father        colon cancer   Hypertension Father    Stroke Father    Cancer Brother    Heart disease Maternal Uncle        CAD   Cancer Paternal Aunt        colon cancer   Asthma Neg Hx     Diabetes Neg Hx    Ovarian cancer Neg Hx    Breast cancer Neg Hx     Social History   Socioeconomic History   Marital status: Married    Spouse name: Not on file   Number of children: 0   Years of education: Not on file   Highest education level: Not on file  Occupational History   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  Tobacco Use   Smoking status: Never   Smokeless tobacco: Never  Vaping Use   Vaping Use: Never used  Substance and Sexual Activity   Alcohol use: No    Alcohol/week: 0.0 standard drinks of alcohol   Drug use: No   Sexual activity: Not Currently  Other Topics Concern   Not on file  Social History Narrative   Regular exercise Yes   Social Determinants of Health   Financial Resource Strain: Not on file  Food Insecurity: Not on file  Transportation Needs: Not on file  Physical Activity: Insufficiently Active (06/15/2018)   Exercise Vital Sign    Days of Exercise per Week: 2 days    Minutes of Exercise per Session: 30 min  Stress: Not on file  Social Connections: Not on file  Intimate Partner Violence: Not on file   Review of Systems  Constitutional:  Positive for fatigue. Negative for unexpected weight change.       Wears seat betl  HENT:  Positive for tinnitus. Negative for dental problem and trouble swallowing.        Just mild hearing loss---aides didn't help chronic worsening tinnitus Overdue for dentist  Eyes:  Negative for visual disturbance.       No diplopia or unilateral vision loss  Respiratory:  Negative for cough, chest tightness and shortness of breath.   Cardiovascular:  Negative for chest pain.       Occ brief flutter--nothing new  Gastrointestinal:  Negative for blood in stool and constipation.       Rare heartburn--related to caffeine (uses prilosec rarely for prn)  Endocrine: Negative for polydipsia and polyuria.  Genitourinary:  Negative for dyspareunia, dysuria and hematuria.  Musculoskeletal:   Negative for back pain and joint swelling.       Typical aches and pains  Skin:  Negative for rash.  Allergic/Immunologic: Positive for environmental allergies. Negative for immunocompromised state.       Mild symptoms--no meds in general  Neurological:  Negative for syncope, light-headedness and headaches.       Does get dizzy at times--positional (like if on the floor)  Hematological:  Negative for adenopathy. Does not bruise/bleed easily.  Psychiatric/Behavioral:  Negative for dysphoric mood. The patient is not nervous/anxious.        Sleeping well--even off the PM       Objective:   Physical Exam Constitutional:      Appearance: Normal appearance.  HENT:     Mouth/Throat:     Pharynx: No oropharyngeal exudate or posterior oropharyngeal erythema.  Eyes:     Conjunctiva/sclera: Conjunctivae normal.     Pupils: Pupils are equal, round, and reactive to light.  Cardiovascular:     Rate and Rhythm: Normal rate and regular rhythm.     Pulses: Normal pulses.     Heart sounds: No murmur heard.    No gallop.  Pulmonary:     Effort: Pulmonary effort is normal.     Breath sounds: Normal breath sounds. No wheezing or rales.  Abdominal:     Palpations: Abdomen is soft.     Tenderness: There is no abdominal tenderness.  Musculoskeletal:     Cervical back: Neck supple.     Right lower leg: No edema.     Left lower leg: No edema.  Lymphadenopathy:     Cervical: No cervical adenopathy.  Skin:    Findings: No rash.     Comments: Slight puffiness under lateral malleoli bilaterally--no true edema  Neurological:     General: No focal deficit present.     Mental Status: She is alert and oriented to person, place, and time.  Psychiatric:        Mood and Affect: Mood normal.        Behavior: Behavior normal.            Assessment & Plan:

## 2023-03-30 NOTE — Assessment & Plan Note (Signed)
Healthy Needs to get back to exercise Colon next month Just had mammogram--does yearly Pap per gyn Prefers no flu, COVID or shingrix vaccines

## 2024-01-19 NOTE — Patient Instructions (Signed)
 Preventive Care 39-59 Years Old, Female Preventive care refers to lifestyle choices and visits with your health care provider that can promote health and wellness. Preventive care visits are also called wellness exams. What can I expect for my preventive care visit? Counseling Your health care provider may ask you questions about your: Medical history, including: Past medical problems. Family medical history. Pregnancy history. Current health, including: Menstrual cycle. Method of birth control. Emotional well-being. Home life and relationship well-being. Sexual activity and sexual health. Lifestyle, including: Alcohol, nicotine or tobacco, and drug use. Access to firearms. Diet, exercise, and sleep habits. Work and work Astronomer. Sunscreen use. Safety issues such as seatbelt and bike helmet use. Physical exam Your health care provider will check your: Height and weight. These may be used to calculate your BMI (body mass index). BMI is a measurement that tells if you are at a healthy weight. Waist circumference. This measures the distance around your waistline. This measurement also tells if you are at a healthy weight and may help predict your risk of certain diseases, such as type 2 diabetes and high blood pressure. Heart rate and blood pressure. Body temperature. Skin for abnormal spots. What immunizations do I need?  Vaccines are usually given at various ages, according to a schedule. Your health care provider will recommend vaccines for you based on your age, medical history, and lifestyle or other factors, such as travel or where you work. What tests do I need? Screening Your health care provider may recommend screening tests for certain conditions. This may include: Lipid and cholesterol levels. Diabetes screening. This is done by checking your blood sugar (glucose) after you have not eaten for a while (fasting). Pelvic exam and Pap test. Hepatitis B test. Hepatitis C  test. HIV (human immunodeficiency virus) test. STI (sexually transmitted infection) testing, if you are at risk. Lung cancer screening. Colorectal cancer screening. Mammogram. Talk with your health care provider about when you should start having regular mammograms. This may depend on whether you have a family history of breast cancer. BRCA-related cancer screening. This may be done if you have a family history of breast, ovarian, tubal, or peritoneal cancers. Bone density scan. This is done to screen for osteoporosis. Talk with your health care provider about your test results, treatment options, and if necessary, the need for more tests. Follow these instructions at home: Eating and drinking  Eat a diet that includes fresh fruits and vegetables, whole grains, lean protein, and low-fat dairy products. Take vitamin and mineral supplements as recommended by your health care provider. Do not drink alcohol if: Your health care provider tells you not to drink. You are pregnant, may be pregnant, or are planning to become pregnant. If you drink alcohol: Limit how much you have to 0-1 drink a day. Know how much alcohol is in your drink. In the [place] one drink equals one 12 oz bottle of beer (355 mL), one 5 oz glass of wine (148 mL), or one 1 oz glass of hard liquor (44 mL). Lifestyle Brush your teeth every morning and night with fluoride toothpaste. Floss one time each day. Exercise for at least 30 minutes 5 or more days each week. Do not use any products that contain nicotine or tobacco. These products include cigarettes, chewing tobacco, and vaping devices, such as e-cigarettes. If you need help quitting, ask your health care provider. Do not use drugs. If you are sexually active, practice safe sex. Use a condom or other form of protection to  prevent STIs. If you do not wish to become pregnant, use a form of birth control. If you plan to become pregnant, see your health care provider for a  prepregnancy visit. Take aspirin only as told by your health care provider. Make sure that you understand how much to take and what form to take. Work with your health care provider to find out whether it is safe and beneficial for you to take aspirin daily. Find healthy ways to manage stress, such as: Meditation, yoga, or listening to music. Journaling. Talking to a trusted person. Spending time with friends and family. Minimize exposure to UV radiation to reduce your risk of skin cancer. Safety Always wear your seat belt while driving or riding in a vehicle. Do not drive: If you have been drinking alcohol. Do not ride with someone who has been drinking. When you are tired or distracted. While texting. If you have been using any mind-altering substances or drugs. Wear a helmet and other protective equipment during sports activities. If you have firearms in your house, make sure you follow all gun safety procedures. Seek help if you have been physically or sexually abused. What's next? Visit your health care provider once a year for an annual wellness visit. Ask your health care provider how often you should have your eyes and teeth checked. Stay up to date on all vaccines. This information is not intended to replace advice given to you by your health care provider. Make sure you discuss any questions you have with your health care provider. Document Revised: 04/03/2021 Document Reviewed: 04/03/2021 Elsevier Patient Education  2024 [employer] Breast Self-Awareness Breast self-awareness is knowing how your breasts look and feel. You need to: Check your breasts on a regular basis. Tell your doctor about any changes. Become familiar with the look and feel of your breasts. This can help you catch a breast problem while it is still small and can be treated. You should do breast self-exams even if you have breast implants. What you need: A mirror. A well-lit room. A pillow or other  soft object. How to do a breast self-exam Follow these steps to do a breast self-exam: Look for changes  Take off all the clothes above your waist. Stand in front of a mirror in a room with good lighting. Put your hands down at your sides. Compare your breasts in the mirror. Look for any difference between them, such as: A difference in shape. A difference in size. Wrinkles, dips, and bumps in one breast and not the other. Look at each breast for changes in the skin, such as: Redness. Scaly areas. Skin that has gotten thicker. Dimpling. Open sores (ulcers). Look for changes in your nipples, such as: Fluid coming out of a nipple. Fluid around a nipple. Bleeding. Dimpling. Redness. A nipple that looks pushed in (retracted), or that has changed position. Feel for changes Lie on your back. Feel each breast. To do this: Pick a breast to feel. Place a pillow under the shoulder closest to that breast. Put the arm closest to that breast behind your head. Feel the nipple area of that breast using the hand of your other arm. Feel the area with the pads of your three middle fingers by making small circles with your fingers. Use light, medium, and firm pressure. Continue the overlapping circles, moving downward over the breast. Keep making circles with your fingers. Stop when you feel your ribs. Start making circles with your fingers again, this time going  upward until you reach your collarbone. Then, make circles outward across your breast and into your armpit area. Squeeze your nipple. Check for discharge and lumps. Repeat these steps to check your other breast. Sit or stand in the tub or shower. With soapy water on your skin, feel each breast the same way you did when you were lying down. Write down what you find Writing down what you find can help you remember what to tell your doctor. Write down: What is normal for each breast. Any changes you find in each breast. These  include: The kind of changes you find. A tender or painful breast. Any lump you find. Write down its size and where it is. When you last had your monthly period (menstrual cycle). General tips If you are breastfeeding, the best time to check your breasts is after you feed your baby or after you use a breast pump. If you get monthly bleeding, the best time to check your breasts is 5-7 days after your monthly cycle ends. With time, you will become comfortable with the self-exam. You will also start to know if there are changes in your breasts. Contact a doctor if: You see a change in the shape or size of your breasts or nipples. You see a change in the skin of your breast or nipples, such as red or scaly skin. You have fluid coming from your nipples that is not normal. You find a new lump or thick area. You have breast pain. You have any concerns about your breast health. Summary Breast self-awareness includes looking for changes in your breasts and feeling for changes within your breasts. You should do breast self-awareness in front of a mirror in a well-lit room. If you get monthly periods (menstrual cycles), the best time to check your breasts is 5-7 days after your period ends. Tell your doctor about any changes you see in your breasts. Changes include changes in size, changes on the skin, painful or tender breasts, or fluid from your nipples that is not normal. This information is not intended to replace advice given to you by your health care provider. Make sure you discuss any questions you have with your health care provider. Document Revised: 03/13/2022 Document Reviewed: 08/08/2021 Elsevier Patient Education  2024 [employer].

## 2024-01-19 NOTE — Progress Notes (Unsigned)
 ANNUAL PREVENTATIVE CARE GYNECOLOGY  ENCOUNTER NOTE  Subjective:       Amy Riley is a 58 y.o. G0P0000 female here for a routine annual gynecologic exam.  She has a history of VIN II-III and hypothyroidism. The patient is not sexually active. The patient has never take hormone replacement therapy. Patient denies post-menopausal vaginal bleeding. The patient wears seatbelts: yes. The patient participates in regular exercise: yes. Has the patient ever been transfused or tattooed?: no.    Current complaints: 1.  None. Does report some cognitive decline in her husband so is now caring for him full time. Also notes a brother who was diagnosed with renal cancer last year.  Reports last year was very stressful.     Gynecologic History No LMP recorded (lmp unknown). Patient is postmenopausal. Contraception: post menopausal status Last Pap: 01/01/2022. Results were: normal Last mammogram: 03/09/2023. Results were: normal Last Colonoscopy: 7/12/024.  Results: normal. Repeat in 5 years due to family history of colon cancer Last Dexa Scan: Never had one   Obstetric History OB History  Gravida Para Term Preterm AB Living  0 0 0 0 0 0  SAB IAB Ectopic Multiple Live Births  0 0 0 0     Past Medical History:  Diagnosis Date   Broken foot    Cancer (HCC)    basal cell   GERD (gastroesophageal reflux disease)    Hypothyroidism     Family History  Problem Relation Age of Onset   Heart disease Father        atrial fib   Cancer Father        colon cancer   Hypertension Father    Stroke Father    Cancer Brother        urothelial   Heart disease Maternal Uncle        CAD   Cancer Paternal Aunt        colon cancer   Asthma Neg Hx    Diabetes Neg Hx    Ovarian cancer Neg Hx    Breast cancer Neg Hx     Past Surgical History:  Procedure Laterality Date   CHOLECYSTECTOMY, LAPAROSCOPIC  12/16   [facility]   DORSAL COMPARTMENT RELEASE Right 10/12/2019   wrist   NASAL SEPTOPLASTY W/  TURBINOPLASTY  06/1986   reduction   SHOULDER ARTHROSCOPY Right 03/2017   SKIN GRAFT  11/2002   Nasal (for epistaxis)    Social History   Socioeconomic History   Marital status: Married    Spouse name: Not on file   Number of children: 0   Years of education: Not on file   Highest education level: Not on file  Occupational History   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  Tobacco Use   Smoking status: Never   Smokeless tobacco: Never  Vaping Use   Vaping status: Never Used  Substance and Sexual Activity   Alcohol use: No    Alcohol/week: 0.0 standard drinks of alcohol   Drug use: No   Sexual activity: Not Currently  Other Topics Concern   Not on file  Social History Narrative   Regular exercise Yes   Social Drivers of Health   Financial Resource Strain: Not on file  Food Insecurity: Not on file  Transportation Needs: Not on file  Physical Activity: Insufficiently Active (06/15/2018)   Exercise Vital Sign    Days of Exercise per Week: 2 days    Minutes of Exercise per  Session: 30 min  Stress: Not on file  Social Connections: Not on file  Intimate Partner Violence: Not on file    Current Outpatient Medications on File Prior to Visit  Medication Sig Dispense Refill   levothyroxine (SYNTHROID) 50 MCG tablet TAKE 1 TABLET BY MOUTH ONCE DAILY ON AN EMPTY STOMACH.WAIT 30 MINS BEFORE OTHER MEDS. 90 tablet 3   No current facility-administered medications on file prior to visit.    Allergies  Allergen Reactions   Aspirin     REACTION: nose bleeds   Bacid Other (See Comments)    Rectal spasms   Cefdinir Other (See Comments)   Cephalosporins Other (See Comments)   Nsaids Other (See Comments)    bleeding bleeding   Tolmetin     Other reaction(s): Other (See Comments) bleeding      Review of Systems ROS Review of Systems - General ROS: negative for - chills, fatigue, fever, hot flashes, night sweats, weight gain or weight  loss Psychological ROS: negative for - anxiety, decreased libido, depression, mood swings, physical abuse or sexual abuse Ophthalmic ROS: negative for - blurry vision, eye pain or loss of vision ENT ROS: negative for - headaches, hearing change, visual changes or vocal changes Allergy and Immunology ROS: negative for - hives, itchy/watery eyes or seasonal allergies Hematological and Lymphatic ROS: negative for - bleeding problems, bruising, swollen lymph nodes or weight loss Endocrine ROS: negative for - galactorrhea, hair pattern changes, hot flashes, malaise/lethargy, mood swings, palpitations, polydipsia/polyuria, skin changes, temperature intolerance or unexpected weight changes Breast ROS: negative for - new or changing breast lumps or nipple discharge Respiratory ROS: negative for - cough or shortness of breath Cardiovascular ROS: negative for - chest pain, irregular heartbeat, palpitations or shortness of breath Gastrointestinal ROS: no abdominal pain, change in bowel habits, or black or bloody stools Genito-Urinary ROS: no dysuria, trouble voiding, or hematuria Musculoskeletal ROS: negative for - joint pain or joint stiffness Neurological ROS: negative for - bowel and bladder control changes Dermatological ROS: negative for rash and skin lesion changes   Objective:   BP 113/67   Pulse 78   Resp 16   Ht 5' 3.5" (1.613 m)   Wt 123 lb 11.2 oz (56.1 kg)   LMP  (LMP Unknown)   BMI 21.57 kg/m  CONSTITUTIONAL: Well-developed, well-nourished female in no acute distress.  PSYCHIATRIC: Normal mood and affect. Normal behavior. Normal judgment and thought content. NEUROLGIC: Alert and oriented to person, place, and time. Normal muscle tone coordination. No cranial nerve deficit noted. HENT:  Normocephalic, atraumatic, External right and left ear normal. Oropharynx is clear and moist EYES: Conjunctivae and EOM are normal. Pupils are equal, round, and reactive to light. No scleral icterus.   NECK: Normal range of motion, supple, no masses.  Normal thyroid.  SKIN: Skin is warm and dry. No rash noted. Not diaphoretic. No erythema. No pallor. CARDIOVASCULAR: Normal heart rate noted, regular rhythm, no murmur. RESPIRATORY: Clear to auscultation bilaterally. Effort and breath sounds normal, no problems with respiration noted. BREASTS: Symmetric in size. No masses, skin changes, nipple drainage, or lymphadenopathy. ABDOMEN: Soft, normal bowel sounds, no distention noted.  No tenderness, rebound or guarding.  BLADDER: Normal PELVIC:  Bladder no bladder distension noted  Urethra: normal appearing urethra with no masses, tenderness or lesions  Vulva: normal appearing vulva with no masses, tenderness or lesions  Vagina: moderately atrophic, no lesions or discharge.  Requires pediatric speculum.   Cervix: partially visualized due to discomfort, slightly flushed with  vaginal wall.   Uterus: uterus is normal size, shape, consistency and nontender  Adnexa: normal adnexa in size, nontender and no masses  RV: External Exam NormaI, No Rectal Masses, and Normal Sphincter tone  MUSCULOSKELETAL: Normal range of motion. No tenderness.  No cyanosis, clubbing, or edema.  2+ distal pulses. LYMPHATIC: No Axillary, Supraclavicular, or Inguinal Adenopathy.   Labs: Lab Results  Component Value Date   WBC 6.3 03/30/2023   HGB 13.7 03/30/2023   HCT 41.6 03/30/2023   MCV 91.4 03/30/2023   PLT 164.0 03/30/2023    Lab Results  Component Value Date   CREATININE 0.71 03/30/2023   BUN 15 03/30/2023   NA 139 03/30/2023   K 4.1 03/30/2023   CL 102 03/30/2023   CO2 28 03/30/2023    Lab Results  Component Value Date   ALT 14 03/30/2023   AST 22 03/30/2023   ALKPHOS 84 03/30/2023   BILITOT 0.9 03/30/2023    Lab Results  Component Value Date   CHOL 219 (H) 03/30/2023   HDL 89.00 03/30/2023   LDLCALC 117 (H) 03/30/2023   LDLDIRECT 122.9 08/26/2013   TRIG 62.0 03/30/2023   CHOLHDL 2  03/30/2023    Lab Results  Component Value Date   TSH 4.58 03/30/2023    No results found for: "HGBA1C"   Assessment:   1. Encounter for well woman exam with routine gynecological exam   2. Encounter for screening mammogram for malignant neoplasm of breast   3. 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 Due in 1 year.  Mammogram: Ordered Colon Screening:   UTD Labs: Performed by PCP.  Routine preventative health maintenance measures emphasized:  Self Breast Exams, Exercise/Diet/Weight control, and Stress Management Vaginal atrophy, overall asymptomatic, no treatment needed at this time.  History of VIN, no visible lesions, remains stable.  Return to Clinic - 1 Year   Hildred Laser, MD [facility]

## 2024-01-20 ENCOUNTER — Ambulatory Visit (INDEPENDENT_AMBULATORY_CARE_PROVIDER_SITE_OTHER): Payer: Self-pay | Admitting: Obstetrics and Gynecology

## 2024-01-20 ENCOUNTER — Encounter: Payer: Self-pay | Admitting: Obstetrics and Gynecology

## 2024-01-20 VITALS — BP 113/67 | HR 78 | Resp 16 | Ht 63.5 in | Wt 123.7 lb

## 2024-01-20 DIAGNOSIS — Z01419 Encounter for gynecological examination (general) (routine) without abnormal findings: Secondary | ICD-10-CM

## 2024-01-20 DIAGNOSIS — N952 Postmenopausal atrophic vaginitis: Secondary | ICD-10-CM

## 2024-01-20 DIAGNOSIS — Z1231 Encounter for screening mammogram for malignant neoplasm of breast: Secondary | ICD-10-CM

## 2024-01-20 DIAGNOSIS — N900 Mild vulvar dysplasia: Secondary | ICD-10-CM

## 2024-01-29 ENCOUNTER — Other Ambulatory Visit: Payer: Self-pay | Admitting: Internal Medicine

## 2024-01-29 DIAGNOSIS — Z1231 Encounter for screening mammogram for malignant neoplasm of breast: Secondary | ICD-10-CM

## 2024-02-10 ENCOUNTER — Ambulatory Visit: Payer: Self-pay | Admitting: Internal Medicine

## 2024-03-09 ENCOUNTER — Ambulatory Visit
Admission: RE | Admit: 2024-03-09 | Discharge: 2024-03-09 | Disposition: A | Discharge status: Home / Self Care | Source: Ambulatory Visit | Attending: Internal Medicine | Admitting: Internal Medicine

## 2024-03-09 DIAGNOSIS — Z1231 Encounter for screening mammogram for malignant neoplasm of breast: Secondary | ICD-10-CM | POA: Insufficient documentation

## 2024-03-17 ENCOUNTER — Ambulatory Visit: Payer: Self-pay | Admitting: Internal Medicine

## 2024-03-30 ENCOUNTER — Encounter: Payer: BC Managed Care – PPO | Admitting: Internal Medicine

## 2024-04-05 ENCOUNTER — Encounter: Payer: Self-pay | Admitting: Internal Medicine

## 2024-04-05 ENCOUNTER — Ambulatory Visit (INDEPENDENT_AMBULATORY_CARE_PROVIDER_SITE_OTHER): Admitting: Internal Medicine

## 2024-04-05 VITALS — BP 112/70 | HR 74 | Temp 98.1°F | Ht 62.5 in | Wt 120.0 lb

## 2024-04-05 DIAGNOSIS — K219 Gastro-esophageal reflux disease without esophagitis: Secondary | ICD-10-CM

## 2024-04-05 DIAGNOSIS — Z0000 Encounter for general adult medical examination without abnormal findings: Secondary | ICD-10-CM | POA: Diagnosis not present

## 2024-04-05 DIAGNOSIS — E039 Hypothyroidism, unspecified: Secondary | ICD-10-CM | POA: Diagnosis not present

## 2024-04-05 LAB — TSH: TSH: 3.02 u[IU]/mL (ref 0.35–5.50)

## 2024-04-05 LAB — CBC
HCT: 40 % (ref 36.0–46.0)
Hemoglobin: 13.6 g/dL (ref 12.0–15.0)
MCHC: 33.9 g/dL (ref 30.0–36.0)
MCV: 90.1 fl (ref 78.0–100.0)
Platelets: 172 10*3/uL (ref 150.0–400.0)
RBC: 4.44 Mil/uL (ref 3.87–5.11)
RDW: 13.7 % (ref 11.5–15.5)
WBC: 5.2 10*3/uL (ref 4.0–10.5)

## 2024-04-05 LAB — COMPREHENSIVE METABOLIC PANEL WITH GFR
ALT: 18 U/L (ref 0–35)
AST: 22 U/L (ref 0–37)
Albumin: 4.4 g/dL (ref 3.5–5.2)
Alkaline Phosphatase: 98 U/L (ref 39–117)
BUN: 15 mg/dL (ref 6–23)
CO2: 30 meq/L (ref 19–32)
Calcium: 9.4 mg/dL (ref 8.4–10.5)
Chloride: 102 meq/L (ref 96–112)
Creatinine, Ser: 0.8 mg/dL (ref 0.40–1.20)
GFR: 80.68 mL/min (ref >60.00)
Glucose, Bld: 79 mg/dL (ref 70–99)
Potassium: 4.2 meq/L (ref 3.5–5.1)
Sodium: 139 meq/L (ref 135–145)
Total Bilirubin: 0.7 mg/dL (ref 0.2–1.2)
Total Protein: 7.4 g/dL (ref 6.0–8.3)

## 2024-04-05 LAB — LIPID PANEL
Cholesterol: 218 mg/dL — ABNORMAL HIGH (ref 0–200)
HDL: 78.4 mg/dL (ref >39.00)
LDL Cholesterol: 127 mg/dL — ABNORMAL HIGH (ref 0–99)
NonHDL: 139.62
Total CHOL/HDL Ratio: 3
Triglycerides: 63 mg/dL (ref 0.0–149.0)
VLDL: 12.6 mg/dL (ref 0.0–40.0)

## 2024-04-05 LAB — T4, FREE: Free T4: 0.99 ng/dL (ref 0.60–1.60)

## 2024-04-05 NOTE — Assessment & Plan Note (Signed)
 Seems euthryoid on 50mcg daily Will check labs

## 2024-04-05 NOTE — Assessment & Plan Note (Signed)
 Uses omeprazole prn (rarely)

## 2024-04-05 NOTE — Progress Notes (Signed)
 Subjective:    Patient ID: Amy Riley, female    DOB: [birth year], 58 y.o.   MRN: [id number]  HPI Here for physical  Doing okay with her health Joint/arthritis pain more noticeable--mostly back/neck (some worse with crafts) Tries to exercise---hard to get motivated Getting PT for shoulder/neck currently--also sees chiropractic Tries to avoid medications  Current Outpatient Medications on File Prior to Visit  Medication Sig Dispense Refill   levothyroxine  (SYNTHROID ) 50 MCG tablet TAKE 1 TABLET BY MOUTH ONCE DAILY ON AN EMPTY STOMACH.WAIT 30 MINS BEFORE OTHER MEDS. 90 tablet 3   No current facility-administered medications on file prior to visit.    Allergies  Allergen Reactions   Aspirin     REACTION: nose bleeds   Bacid Other (See Comments)    Rectal spasms   Cefdinir Other (See Comments)   Cephalosporins Other (See Comments)   Nsaids Other (See Comments)    bleeding bleeding   Tolmetin     Other reaction(s): Other (See Comments) bleeding    Past Medical History:  Diagnosis Date   Broken foot    Cancer (HCC)    basal cell   GERD (gastroesophageal reflux disease)    Hypothyroidism     Past Surgical History:  Procedure Laterality Date   CHOLECYSTECTOMY, LAPAROSCOPIC  09/2015   [facility]   DORSAL COMPARTMENT RELEASE Right 10/12/2019   wrist   NASAL SEPTOPLASTY W/ TURBINOPLASTY  06/1986   reduction   SHOULDER ARTHROSCOPY Right 03/2017   SKIN GRAFT  11/2002   Nasal (for epistaxis)    Family History  Problem Relation Age of Onset   Osteoarthritis Mother    Heart disease Father        atrial fib   Cancer Father        colon cancer   Hypertension Father    Stroke Father    Cancer Brother        urothelial   Heart disease Maternal Uncle        CAD   Cancer Paternal Aunt        colon cancer   Asthma Neg Hx    Diabetes Neg Hx    Ovarian cancer Neg Hx    Breast cancer Neg Hx     Social History   Socioeconomic History   Marital status: Married     Spouse name: Not on file   Number of children: 0   Years of education: Not on file   Highest education level: Associate degree: occupational, Scientist, product/process development, or vocational program  Occupational History   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  Tobacco Use   Smoking status: Never   Smokeless tobacco: Never  Vaping Use   Vaping status: Never Used  Substance and Sexual Activity   Alcohol use: No    Alcohol/week: 0.0 standard drinks of alcohol   Drug use: No   Sexual activity: Not Currently  Other Topics Concern   Not on file  Social History Narrative   Regular exercise Yes   Social Drivers of Health   Financial Resource Strain: Low Risk  (04/03/2024)   Overall Financial Resource Strain (CARDIA)    Difficulty of Paying Living Expenses: Not hard at all  Food Insecurity: No Food Insecurity (04/03/2024)   Hunger Vital Sign    Worried About Running Out of Food in the Last Year: Never true    Ran Out of Food in the Last Year: Never true  Transportation Needs: No Transportation Needs (04/03/2024)  PRAPARE - Administrator, Civil Service (Medical): No    Lack of Transportation (Non-Medical): No  Physical Activity: Insufficiently Active (04/03/2024)   Exercise Vital Sign    Days of Exercise per Week: 3 days    Minutes of Exercise per Session: 30 min  Stress: No Stress Concern Present (04/03/2024)   [employer] of Occupational Health - Occupational Stress Questionnaire    Feeling of Stress: Not at all  Social Connections: Socially Integrated (04/03/2024)   Social Connection and Isolation Panel    Frequency of Communication with Friends and Family: More than three times a week    Frequency of Social Gatherings with Friends and Family: Twice a week    Attends Religious Services: More than 4 times per year    Active Member of [employer] or Organizations: Yes    Attends Engineer, structural: More than 4 times per year    Marital  Status: Married  Catering manager Violence: Not on file   Review of Systems  Constitutional:  Negative for fatigue and unexpected weight change.       Wears seat belt  HENT:  Positive for tinnitus.        Only slight hearing loss Keeps up with dentist  Eyes:        Some blurry vision--will get it checked  Respiratory:  Negative for cough, chest tightness and shortness of breath.   Cardiovascular:  Negative for chest pain, palpitations and leg swelling.  Gastrointestinal:  Negative for blood in stool and constipation.       Occasional heartburn--prilosec prn  Endocrine: Negative for polydipsia and polyuria.  Genitourinary:  Negative for difficulty urinating, dyspareunia, dysuria and hematuria.  Musculoskeletal:  Positive for back pain and neck pain. Negative for joint swelling.  Skin:  Negative for rash.       No suspicious lesions--sees derm  Allergic/Immunologic: Positive for environmental allergies. Negative for immunocompromised state.       Doesn't use meds  Neurological:  Negative for dizziness, syncope, light-headedness and headaches.  Hematological:  Negative for adenopathy. Bruises/bleeds easily.  Psychiatric/Behavioral:  Negative for dysphoric mood and sleep disturbance. The patient is not nervous/anxious.        Objective:   Physical Exam Constitutional:      Appearance: Normal appearance.  HENT:     Mouth/Throat:     Pharynx: No oropharyngeal exudate or posterior oropharyngeal erythema.   Eyes:     Conjunctiva/sclera: Conjunctivae normal.     Pupils: Pupils are equal, round, and reactive to light.    Cardiovascular:     Rate and Rhythm: Normal rate and regular rhythm.     Pulses: Normal pulses.     Heart sounds: No murmur heard.    No gallop.  Pulmonary:     Effort: Pulmonary effort is normal.     Breath sounds: Normal breath sounds. No wheezing or rales.  Abdominal:     Palpations: Abdomen is soft.     Tenderness: There is no abdominal tenderness.    Musculoskeletal:     Cervical back: Neck supple.     Right lower leg: No edema.     Left lower leg: No edema.  Lymphadenopathy:     Cervical: No cervical adenopathy.   Skin:    Findings: No rash.   Neurological:     General: No focal deficit present.     Mental Status: She is alert and oriented to person, place, and time.   Psychiatric:  Mood and Affect: Mood normal.        Behavior: Behavior normal.            Assessment & Plan:

## 2024-04-05 NOTE — Assessment & Plan Note (Addendum)
 Healthy Needs to get more regular with exercise again Recent colon--due in 5 years due to FH Just had mammogram--gets yearly Pap per gyn Prefers no COVID, flu or shingrix vaccines  Transferring care to [facility]

## 2024-04-06 ENCOUNTER — Ambulatory Visit: Payer: Self-pay | Admitting: Internal Medicine
